# Patient Record
Sex: Male | Born: 2000 | Race: Black or African American | Hispanic: No | Marital: Single | Smoking: Never smoker
Health system: Southern US, Community
[De-identification: ages and names within clinical notes are randomized; demographics above are authoritative.]

## PROBLEM LIST (undated history)

## (undated) ENCOUNTER — Ambulatory Visit: Admission: EM | Payer: Medicaid Other | Source: Home / Self Care

## (undated) DIAGNOSIS — J45909 Unspecified asthma, uncomplicated: Secondary | ICD-10-CM

## (undated) DIAGNOSIS — F88 Other disorders of psychological development: Secondary | ICD-10-CM

## (undated) DIAGNOSIS — F819 Developmental disorder of scholastic skills, unspecified: Secondary | ICD-10-CM

## (undated) HISTORY — DX: Developmental disorder of scholastic skills, unspecified: F81.9

## (undated) HISTORY — DX: Other disorders of psychological development: F88

## (undated) HISTORY — DX: Unspecified asthma, uncomplicated: J45.909

---
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
## 2020-01-31 ENCOUNTER — Ambulatory Visit (INDEPENDENT_AMBULATORY_CARE_PROVIDER_SITE_OTHER): Payer: Medicaid Other | Admitting: Pediatrics

## 2020-01-31 ENCOUNTER — Encounter: Payer: Self-pay | Admitting: Pediatrics

## 2020-01-31 ENCOUNTER — Other Ambulatory Visit: Payer: Self-pay

## 2020-01-31 VITALS — Temp 98.1°F | Wt 115.2 lb

## 2020-01-31 DIAGNOSIS — R6889 Other general symptoms and signs: Secondary | ICD-10-CM

## 2020-01-31 LAB — POCT HEMOGLOBIN: Hemoglobin: 12.2 g/dL (ref 11–14.6)

## 2020-02-01 ENCOUNTER — Encounter: Payer: Self-pay | Admitting: Pediatrics

## 2020-02-01 LAB — COMPLETE METABOLIC PANEL WITH GFR
AG Ratio: 1.7 (calc) (ref 1.0–2.5)
ALT: 14 U/L (ref 8–46)
AST: 20 U/L (ref 12–32)
Albumin: 4.5 g/dL (ref 3.6–5.1)
Alkaline phosphatase (APISO): 77 U/L (ref 46–169)
BUN: 9 mg/dL (ref 7–20)
CO2: 27 mmol/L (ref 20–32)
Calcium: 9.7 mg/dL (ref 8.9–10.4)
Chloride: 103 mmol/L (ref 98–110)
Creat: 1.09 mg/dL (ref 0.60–1.26)
GFR, Est African American: 113 mL/min/{1.73_m2} (ref >=60)
GFR, Est Non African American: 98 mL/min/{1.73_m2} (ref >=60)
Globulin: 2.7 g/dL (calc) (ref 2.1–3.5)
Glucose, Bld: 95 mg/dL (ref 65–99)
Potassium: 4 mmol/L (ref 3.8–5.1)
Sodium: 139 mmol/L (ref 135–146)
Total Bilirubin: 0.4 mg/dL (ref 0.2–1.1)
Total Protein: 7.2 g/dL (ref 6.3–8.2)

## 2020-02-01 LAB — CBC WITH DIFFERENTIAL/PLATELET
Absolute Monocytes: 554 cells/uL (ref 200–950)
Basophils Absolute: 50 cells/uL (ref 0–200)
Basophils Relative: 1.2 %
Eosinophils Absolute: 59 cells/uL (ref 15–500)
Eosinophils Relative: 1.4 %
HCT: 43.3 % (ref 38.5–50.0)
Hemoglobin: 14.2 g/dL (ref 13.2–17.1)
Lymphs Abs: 1579 cells/uL (ref 850–3900)
MCH: 28.6 pg (ref 27.0–33.0)
MCHC: 32.8 g/dL (ref 32.0–36.0)
MCV: 87.3 fL (ref 80.0–100.0)
MPV: 10.5 fL (ref 7.5–12.5)
Monocytes Relative: 13.2 %
Neutro Abs: 1957 cells/uL (ref 1500–7800)
Neutrophils Relative %: 46.6 %
Platelets: 235 10*3/uL (ref 140–400)
RBC: 4.96 10*6/uL (ref 4.20–5.80)
RDW: 13.6 % (ref 11.0–15.0)
Total Lymphocyte: 37.6 %
WBC: 4.2 10*3/uL (ref 3.8–10.8)

## 2020-02-01 LAB — TSH+FREE T4: TSH W/REFLEX TO FT4: 3.96 mIU/L (ref 0.50–4.30)

## 2020-02-07 NOTE — Progress Notes (Signed)
Aaron Carroll is here today because his mom would like for him to have blood work. He complains about being cold often. The onset was years ago. No fever, no cough, no runny nose, no vomiting, no diarrhea, no night sweats. He eats 3 times a day. He drinks plenty of water. He does not take any new medications. There has been no sick contacts and no recent travel. He has not loss any hair and there has been no change in weight for him.    Thin male, calm and cooperative Sclera white  No palpable thyroid, no cervical lymphadenopathy Heart sounds normal, RRR, no murmur  Lungs clear  No focal deficits   20 yo male complaining of temperature irregularity  Blood work today to check general electrolytes and thyroid. Will also check hemoglobin Encourage a vitamin daily  Drink plenty of water daily  Questions and concerns were addressed  Follow up as needed

## 2020-03-14 ENCOUNTER — Emergency Department (HOSPITAL_COMMUNITY): Payer: Medicaid Other

## 2020-03-14 ENCOUNTER — Other Ambulatory Visit: Payer: Self-pay

## 2020-03-14 ENCOUNTER — Encounter (HOSPITAL_COMMUNITY): Payer: Self-pay | Admitting: Emergency Medicine

## 2020-03-14 ENCOUNTER — Emergency Department (HOSPITAL_COMMUNITY)
Admission: EM | Admit: 2020-03-14 | Discharge: 2020-03-14 | Disposition: A | Discharge status: Home / Self Care | Payer: Medicaid Other | Attending: Emergency Medicine | Admitting: Emergency Medicine

## 2020-03-14 DIAGNOSIS — J45909 Unspecified asthma, uncomplicated: Secondary | ICD-10-CM | POA: Insufficient documentation

## 2020-03-14 DIAGNOSIS — N4283 Cyst of prostate: Secondary | ICD-10-CM | POA: Diagnosis not present

## 2020-03-14 DIAGNOSIS — R1011 Right upper quadrant pain: Secondary | ICD-10-CM | POA: Diagnosis not present

## 2020-03-14 DIAGNOSIS — R110 Nausea: Secondary | ICD-10-CM | POA: Diagnosis not present

## 2020-03-14 DIAGNOSIS — F909 Attention-deficit hyperactivity disorder, unspecified type: Secondary | ICD-10-CM | POA: Insufficient documentation

## 2020-03-14 DIAGNOSIS — R1031 Right lower quadrant pain: Secondary | ICD-10-CM | POA: Diagnosis not present

## 2020-03-14 DIAGNOSIS — Z7951 Long term (current) use of inhaled steroids: Secondary | ICD-10-CM | POA: Insufficient documentation

## 2020-03-14 DIAGNOSIS — R1032 Left lower quadrant pain: Secondary | ICD-10-CM | POA: Diagnosis not present

## 2020-03-14 DIAGNOSIS — Z79899 Other long term (current) drug therapy: Secondary | ICD-10-CM | POA: Insufficient documentation

## 2020-03-14 DIAGNOSIS — R197 Diarrhea, unspecified: Secondary | ICD-10-CM | POA: Insufficient documentation

## 2020-03-14 LAB — LIPASE, BLOOD: Lipase: 25 U/L (ref 11–51)

## 2020-03-14 LAB — CBC
HCT: 46.9 % (ref 39.0–52.0)
Hemoglobin: 14.9 g/dL (ref 13.0–17.0)
MCH: 28.1 pg (ref 26.0–34.0)
MCHC: 31.8 g/dL (ref 30.0–36.0)
MCV: 88.3 fL (ref 80.0–100.0)
Platelets: 272 10*3/uL (ref 150–400)
RBC: 5.31 MIL/uL (ref 4.22–5.81)
RDW: 12.4 % (ref 11.5–15.5)
WBC: 4.8 10*3/uL (ref 4.0–10.5)
nRBC: 0 % (ref 0.0–0.2)

## 2020-03-14 LAB — COMPREHENSIVE METABOLIC PANEL
ALT: 17 U/L (ref 0–44)
AST: 22 U/L (ref 15–41)
Albumin: 4.9 g/dL (ref 3.5–5.0)
Alkaline Phosphatase: 71 U/L (ref 38–126)
Anion gap: 10 (ref 5–15)
BUN: 10 mg/dL (ref 6–20)
CO2: 26 mmol/L (ref 22–32)
Calcium: 9.6 mg/dL (ref 8.9–10.3)
Chloride: 103 mmol/L (ref 98–111)
Creatinine, Ser: 1.01 mg/dL (ref 0.61–1.24)
GFR calc Af Amer: >60 mL/min (ref >60)
GFR calc non Af Amer: >60 mL/min (ref >60)
Glucose, Bld: 93 mg/dL (ref 70–99)
Potassium: 4.1 mmol/L (ref 3.5–5.1)
Sodium: 139 mmol/L (ref 135–145)
Total Bilirubin: 1 mg/dL (ref 0.3–1.2)
Total Protein: 8.1 g/dL (ref 6.5–8.1)

## 2020-03-14 LAB — URINALYSIS, ROUTINE W REFLEX MICROSCOPIC
Bilirubin Urine: NEGATIVE
Glucose, UA: NEGATIVE mg/dL
Hgb urine dipstick: NEGATIVE
Ketones, ur: NEGATIVE mg/dL
Leukocytes,Ua: NEGATIVE
Nitrite: NEGATIVE
Protein, ur: NEGATIVE mg/dL
Specific Gravity, Urine: 1.017 (ref 1.005–1.030)
pH: 6 (ref 5.0–8.0)

## 2020-03-14 MED ORDER — ALUM & MAG HYDROXIDE-SIMETH 200-200-20 MG/5ML PO SUSP
30.0000 mL | Freq: Once | ORAL | Status: AC
  Administered 2020-03-14: 30 mL via ORAL
  Filled 2020-03-14: qty 30

## 2020-03-14 MED ORDER — OMEPRAZOLE 40 MG PO CPDR
40.0000 mg | DELAYED_RELEASE_CAPSULE | Freq: Every day | ORAL | 0 refills | Status: DC
Start: 2020-03-14 — End: 2021-02-08

## 2020-03-14 MED ORDER — LIDOCAINE VISCOUS HCL 2 % MT SOLN
15.0000 mL | Freq: Once | OROMUCOSAL | Status: AC
  Administered 2020-03-14: 15 mL via ORAL
  Filled 2020-03-14: qty 15

## 2020-03-14 MED ORDER — IOHEXOL 300 MG/ML  SOLN
100.0000 mL | Freq: Once | INTRAMUSCULAR | Status: AC | PRN
  Administered 2020-03-14: 75 mL via INTRAVENOUS

## 2020-03-14 MED ORDER — MORPHINE SULFATE (PF) 4 MG/ML IV SOLN
4.0000 mg | Freq: Once | INTRAVENOUS | Status: AC
  Administered 2020-03-14: 4 mg via INTRAVENOUS
  Filled 2020-03-14: qty 1

## 2020-03-14 MED ORDER — ONDANSETRON HCL 4 MG/2ML IJ SOLN
4.0000 mg | Freq: Once | INTRAMUSCULAR | Status: AC
  Administered 2020-03-14: 4 mg via INTRAVENOUS
  Filled 2020-03-14: qty 2

## 2020-03-14 MED ORDER — SODIUM CHLORIDE 0.9 % IV BOLUS
1000.0000 mL | Freq: Once | INTRAVENOUS | Status: AC
  Administered 2020-03-14: 1000 mL via INTRAVENOUS

## 2020-03-14 MED ORDER — FAMOTIDINE IN NACL 20-0.9 MG/50ML-% IV SOLN
20.0000 mg | Freq: Once | INTRAVENOUS | Status: AC
  Administered 2020-03-14: 20 mg via INTRAVENOUS
  Filled 2020-03-14: qty 50

## 2020-03-14 NOTE — ED Triage Notes (Signed)
Pt c/o sharp stabbing pain for the last three weeks. Pt endorses diarrhea x2 today.

## 2020-03-14 NOTE — ED Notes (Signed)
Reports Stabbing abd pain for tree weeks,   Yet pt has not sought eval for same   Ambulates erect, with easy gait, relaxed facial features , without guarding

## 2020-03-14 NOTE — Discharge Instructions (Addendum)
Your work-up today has been very reassuring, your lab work looks good.  The ultrasound of your gallbladder did not show any evidence of gallstones or other problems and your CT scan did not show anything to explain your pain.   CT did show a 1.5 cm simple prostate cyst, I do not think this is contributing to your pain, you can follow-up with your primary care doctor or urologist regarding this.  Your pain may be related to inflammation of the stomach or an ulcer, please take omeprazole daily at least 30 minutes before your first meal of the day, you can use Tylenol as needed for pain as well.  You can also use over-the-counter Maalox or Tums as needed for breakthrough pain.  Use the instructions provided to help modify your diet.

## 2020-03-14 NOTE — ED Provider Notes (Signed)
[facility] EMERGENCY DEPARTMENT Provider Note   CSN: [id number] Arrival date & time: 03/14/20  1223     History Chief Complaint  Patient presents with  . Abdominal Pain    Aaron Carroll is a 19 y.o. male.  Aaron Carroll is a 19 y.o. male with a history of asthma, learning disability and delayed social development, who presents to the emergency department for evaluation of right-sided abdominal pain.  He states that pain has been present for 2 to 3 weeks.  He has not sought evaluation for this pain until today.  He states over the past 3 days pain, worse.  He states it started a more mild sharp pain only in the right upper quadrant but over the past 3 days it has gotten more severe and is now present across the right side of the abdomen.  He states that it is constant and never goes away, but intermittently worsens.  He reports some nausea and decreased appetite but no vomiting.  Did have some diarrhea over the past few days, no melena or hematochezia.  He denies any associated chest pain or shortness of breath.  No flank pain, hematuria, dysuria or urinary frequency.  He has not taken any medications to treat the symptoms.  Denies similar history.  Denies any history of abdominal surgeries.  No other aggravating factors.        Past Medical History:  Diagnosis Date  . Delayed social development   . 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 Learning disabilities   . Unspecified asthma(493.90) 11/05/2012  . Unspecified asthma(493.90) 11/05/2012    Patient Active Problem List   Diagnosis Date Noted  . Attention deficit hyperactivity disorder (ADHD), combined type 12/26/2017  . Mild persistent asthma with acute exacerbation 05/13/2017  . Dry scalp 05/13/2017  . Weight loss 05/13/2017  . Learning disability 07/05/2015    History reviewed. No pertinent surgical history.     Family History  Problem Relation Age of Onset  . Healthy Mother   . Healthy Father   . Healthy  Maternal Grandmother   . Cancer Maternal Grandfather     Social History   Tobacco Use  . Smoking status: Never Smoker  . Smokeless tobacco: Never Used  Substance Use Topics  . Alcohol use: No  . Drug use: No    Home Medications Prior to Admission medications   Medication Sig Start Date End Date Taking? Authorizing Provider  albuterol (PROAIR HFA) 108 (90 Base) MCG/ACT inhaler INHALE 2 PUFFS INTO THE LUNGS EVERY 4-6 HOURS AS NEEDED FOR WHEEZING. 06/24/19   Rosiland Oz, MD  fluticasone (FLOVENT HFA) 220 MCG/ACT inhaler Inhale 2 puffs into the lungs 2 (two) times daily. INHALE 2 PUFFS TWICE DAILY AS DIRECTED. 12/07/19   Rosiland Oz, MD    Allergies    Patient has no known allergies.  Review of Systems   Review of Systems  Constitutional: Positive for appetite change. Negative for chills and fever.  HENT: Negative.   Respiratory: Negative for cough and shortness of breath.   Cardiovascular: Negative for chest pain.  Gastrointestinal: Positive for abdominal pain, diarrhea and nausea. Negative for vomiting.  Genitourinary: Negative for dysuria and frequency.  Musculoskeletal: Negative for arthralgias and myalgias.  Skin: Negative for color change and rash.  Neurological: Negative for dizziness, syncope and light-headedness.    Physical Exam Updated Vital Signs BP (!) 134/98 (BP Location: Right Arm)   Pulse 70   Temp 97.8 F (36.6 C) (Oral)  Resp 18   Ht 5\' 3"  (1.6 m)   Wt 50.3 kg   SpO2 98%   BMI 19.66 kg/m   Physical Exam Vitals and nursing note reviewed.  Constitutional:      General: He is not in acute distress.    Appearance: He is well-developed and normal weight. He is not diaphoretic.     Comments: Well-appearing and in no distress  HENT:     Head: Normocephalic and atraumatic.     Mouth/Throat:     Mouth: Mucous membranes are moist.     Pharynx: Oropharynx is clear.  Eyes:     General:        Right eye: No discharge.        Left eye: No  discharge.     Pupils: Pupils are equal, round, and reactive to light.  Cardiovascular:     Rate and Rhythm: Normal rate and regular rhythm.     Heart sounds: Normal heart sounds. No murmur heard.  No friction rub. No gallop.   Pulmonary:     Effort: Pulmonary effort is normal. No respiratory distress.     Breath sounds: Normal breath sounds. No wheezing or rales.     Comments: Respirations equal and unlabored, patient able to speak in full sentences, lungs clear to auscultation bilaterally Abdominal:     General: Abdomen is flat. Bowel sounds are normal. There is no distension.     Palpations: Abdomen is soft. There is no mass.     Tenderness: There is abdominal tenderness in the right upper quadrant and right lower quadrant. There is no guarding.     Comments: Abdomen is soft, nondistended, bowel sounds present throughout, tenderness in both the right upper and lower quadrants, most notably in the right upper quadrant, there is some mild voluntary guarding, but no peritoneal signs  Musculoskeletal:        General: No deformity.     Cervical back: Neck supple.  Skin:    General: Skin is warm and dry.     Capillary Refill: Capillary refill takes less than 2 seconds.  Neurological:     Mental Status: He is alert.     Coordination: Coordination normal.     Comments: Speech is clear, able to follow commands Moves extremities without ataxia, coordination intact  Psychiatric:        Mood and Affect: Mood normal.        Behavior: Behavior normal.     ED Results / Procedures / Treatments   Labs (all labs ordered are listed, but only abnormal results are displayed) Labs Reviewed  URINALYSIS, ROUTINE W REFLEX MICROSCOPIC - Abnormal; Notable for the following components:      Result Value   APPearance HAZY (*)    All other components within normal limits  LIPASE, BLOOD  COMPREHENSIVE METABOLIC PANEL  CBC    EKG None  Radiology CT ABDOMEN PELVIS W CONTRAST  Result Date:  03/14/2020 CLINICAL DATA:  Right lower quadrant and left lower quadrant abdominal pain EXAM: CT ABDOMEN AND PELVIS WITH CONTRAST TECHNIQUE: Multidetector CT imaging of the abdomen and pelvis was performed using the standard protocol following bolus administration of intravenous contrast. CONTRAST:  75mL OMNIPAQUE IOHEXOL 300 MG/ML  SOLN COMPARISON:  05/10/2018 FINDINGS: Lower chest: The visualized lung bases are clear. The visualized heart and pericardium are unremarkable. Hepatobiliary: Liver unremarkable. Gallbladder unremarkable. No intra or extrahepatic biliary ductal dilation. Pancreas: Unremarkable Spleen: Unremarkable Adrenals/Urinary Tract: Adrenal glands are unremarkable. Kidneys are normal. Bladder  is unremarkable. Stomach/Bowel: Stomach is within normal limits. Appendix appears normal. No evidence of bowel wall thickening, distention, or inflammatory changes. Vascular/Lymphatic: No significant vascular findings are present. No enlarged abdominal or pelvic lymph nodes. Reproductive: Midline posterior cyst within the prostate gland is compatible with a prostatic utricle measuring 15 mm. Prostate gland seminal vesicles are otherwise unremarkable. Other: No abdominal wall hernia or abnormality. No abdominopelvic ascites. Musculoskeletal: No acute or significant osseous findings. IMPRESSION: 1. No findings to explain the patient's symptoms. Electronically Signed   By: Helyn Numbers MD   On: 03/14/2020 17:12   US Abdomen Limited RUQ  Result Date: 03/14/2020 CLINICAL DATA:  Right upper quadrant pain EXAM: ULTRASOUND ABDOMEN LIMITED RIGHT UPPER QUADRANT COMPARISON:  None. FINDINGS: Gallbladder: No gallstones or wall thickening visualized. No sonographic Murphy sign noted by sonographer. Common bile duct: Diameter: Normal caliber, 2 mm Liver: No focal lesion identified. Within normal limits in parenchymal echogenicity. Portal vein is patent on color Doppler imaging with normal direction of blood flow towards  the liver. Other: None. IMPRESSION: Unremarkable right upper quadrant ultrasound. Electronically Signed   By: Charlett Nose M.D.   On: 03/14/2020 15:51    Procedures Procedures (including critical care time)  Medications Ordered in ED Medications  ondansetron (ZOFRAN) injection 4 mg (4 mg Intravenous Given 03/14/20 1603)  sodium chloride 0.9 % bolus 1,000 mL (1,000 mLs Intravenous New Bag/Given 03/14/20 1602)  morphine 4 MG/ML injection 4 mg (4 mg Intravenous Given 03/14/20 1620)  famotidine (PEPCID) IVPB 20 mg premix (20 mg Intravenous New Bag/Given 03/14/20 1624)  iohexol (OMNIPAQUE) 300 MG/ML solution 100 mL (75 mLs Intravenous Contrast Given 03/14/20 1638)    ED Course  I have reviewed the triage vital signs and the nursing notes.  Pertinent labs & imaging results that were available during my care of the patient were reviewed by me and considered in my medical decision making (see chart for details).    MDM Rules/Calculators/A&P                         Patient presents to the ED with complaints of abdominal pain. Patient nontoxic appearing, in no apparent distress, vitals WNL. On exam patient tender to location primarily in the right upper quadrant but also has some mild tenderness in the right lower quadrant, no peritoneal signs. Will evaluate with labs and right upper quadrant ultrasound, if negative may require CT. Analgesics, anti-emetics, and fluids administered.   ER work-up reviewed:  CBC: No leukocytosis, normal hemoglobin CMP: No significant electrolyte derangements, normal renal and liver function Lipase: WNL UA: No evidence of infection or hematuria  Imaging: Right upper quadrant ultrasound: Unremarkable without evidence of gallstones, cholecystitis, normal diameter of CBD  CT abdomen pelvis with no acute findings to explain patient's pain, incidental finding of a 15 mm prostate cyst.   On repeat abdominal exam patient remains without peritoneal signs, doubt  cholecystitis, pancreatitis, diverticulitis, appendicitis, bowel obstruction/perforation. Patient tolerating PO in the emergency department.  Symptoms may be related to gastritis or peptic ulcer disease, will treat with PPI and continued symptomatic treatment, encouraged GI follow-up.  Discussed urology follow-up for utricle cyst noted on prostate will discharge home with supportive measures. I discussed results, treatment plan, need for PCP follow-up, and return precautions with the patient. Provided opportunity for questions, patient confirmed understanding and is in agreement with plan.    Final Clinical Impression(s) / ED Diagnoses Final diagnoses:  RUQ abdominal pain  Rx / DC Orders ED Discharge Orders    None       Legrand Rams 03/16/20 1542    Vanetta Mulders, MD 03/27/20 [phone]

## 2020-03-15 ENCOUNTER — Telehealth: Payer: Self-pay | Admitting: *Deleted

## 2020-03-15 NOTE — Telephone Encounter (Signed)
Contacted pt   Transition Care Management Follow-up Telephone Call  . Medicaid Managed Care Transition Call Status:MM [facility] Call Made  . Date of discharge and from where: [facility], 03/15/20  . How have you been since you were released from the hospital? "still hurting"  . Any questions or concerns? No  Items Reviewed: Marland Kitchen Did the pt receive and understand the discharge instructions provided? Yes  . Medications obtained and verified? Yes pt to pickup prescription 03/15/20 . Any new allergies since your discharge? No  . Dietary orders reviewed?  No . Do you have support at home?  Yes, family  Functional Questionnaire: (I = Independent and D = Dependent)  ADLs: Independent Bathing/Dressing:Independent Meal Prep: Independent Eating: Independent Maintaining continence: Independent Transferring/Ambulation: Independent Managing Meds: Independent Follow up appointments reviewed:  PCP Hospital f/u appt confirmed? No Pt to contact Dr Meredeth Ide to schedule follow up appt on 03/15/20 Specialist Hospital f/u appt confirmed? Pt to contact Dr Jena Gauss, [facility]  Are transportation arrangements needed? No   If their condition worsens, is the pt aware to call PCP or go to the EmergencyDept.? Yes  Was the patient provided with contact information for the PCP's office or ED? yes  Was to pt encouraged to call back with questions or concerns? Yes  Burnard Bunting, RN, BSN, CCRN [facility] [phone]

## 2020-03-16 ENCOUNTER — Telehealth: Payer: Self-pay | Admitting: Internal Medicine

## 2020-03-16 NOTE — Telephone Encounter (Signed)
Ok to schedule new pt ov.  

## 2020-03-16 NOTE — Telephone Encounter (Signed)
ER REFERRAL  

## 2020-03-17 ENCOUNTER — Encounter: Payer: Self-pay | Admitting: Internal Medicine

## 2020-03-17 NOTE — Telephone Encounter (Signed)
PATIENT SCHEDULED  °

## 2020-03-21 ENCOUNTER — Encounter: Payer: Self-pay | Admitting: Pediatrics

## 2020-03-21 ENCOUNTER — Other Ambulatory Visit: Payer: Self-pay

## 2020-03-21 ENCOUNTER — Ambulatory Visit (INDEPENDENT_AMBULATORY_CARE_PROVIDER_SITE_OTHER): Payer: Medicaid Other | Admitting: Pediatrics

## 2020-03-21 VITALS — Temp 98.0°F | Wt 114.0 lb

## 2020-03-21 DIAGNOSIS — R634 Abnormal weight loss: Secondary | ICD-10-CM

## 2020-03-21 DIAGNOSIS — R109 Unspecified abdominal pain: Secondary | ICD-10-CM

## 2020-03-21 NOTE — Progress Notes (Signed)
  Subjective:     Patient ID: Aaron Carroll, male   DOB: 2000/10/11, 19 y.o.   MRN: [id number]  HPI  The patient is here alone today for follow up of a recent ED visit for abdominal pain. The patient states that his pain is actually more on the right side of his body and just a small area of his abdomen. He states that the pain has been there constantly for about 3 to 4 weeks. He says the pain always feels like a "sharp pain." He denies any known injury to the area. He denies any heavy lifting.  He states that he usually has soft stools, but, will sometimes have problems with hard stools.  The patient was referred to Urology by the ED for a prostatic cyst and to Gastroenterology for his pain.  The patient states that he has been taking ibuprofen about twice per day and it has not helped to decrease his pain.   Histories reviewed by MD    Review of Systems .Review of Symptoms: General ROS: negative for - fatigue and fever ENT ROS: negative for - headaches Respiratory ROS: no cough, shortness of breath, or wheezing Cardiovascular ROS: no chest pain or dyspnea on exertion Gastrointestinal ROS: negative for - change in bowel habits or nausea/vomiting     Objective:   Physical Exam Temp 98 F (36.7 C)   Wt 114 lb (51.7 kg)   BMI 20.19 kg/m   General Appearance:  Alert, cooperative, no distress, appears stated age  Head:  Normocephalic, without obvious abnormality, atraumatic  Eyes:  PERRL, conjunctiva/corneas clear, EOM's intact, fundi benign, both eyes  Ears:  Normal TM's and external ear canals, both ears  Nose: Nares normal, septum midline, mucosa normal, no drainage or sinus tenderness  Throat: Lips, mucosa, and tongue normal; teeth and gums normal  Neck: Supple, symmetrical, trachea midline, no adenopathy  Back:   Tenderness to palpation of right flank   Lungs:   Clear to auscultation bilaterally, respirations unlabored  Heart:  Regular rate and rhythm, S1, S2 normal, no  murmur, rub or gallop  Abdomen:   Soft,  no masses, no organomegaly; tenderness to right lower and upper quadrant  Skin: Skin color, texture, turgor normal, no rashes or lesions       Assessment:     Acute right flank pain Weight loss    Plan:     .1. Acute right flank pain MD reviewed patient's CT of abdomen, lab test results from ED visit and reviewed with patient Overall normal exam today, except for tenderness of right flank with palpation  Discontinue ibuprofen, try heat to area several times per day  - Ambulatory referral to Physical Therapy  2. Weight loss - discussed with patient importance of healthier eating and gave simple examples to try for meals, snacks and drinks     MD also reviewed with patient his upcoming appts with Urology and GI, patient was aware

## 2020-04-07 ENCOUNTER — Ambulatory Visit: Payer: Medicaid Other | Admitting: Urology

## 2020-04-07 ENCOUNTER — Ambulatory Visit
Admission: EM | Admit: 2020-04-07 | Discharge: 2020-04-07 | Disposition: A | Discharge status: Home / Self Care | Payer: Medicaid Other | Attending: Emergency Medicine | Admitting: Emergency Medicine

## 2020-04-07 DIAGNOSIS — Z711 Person with feared health complaint in whom no diagnosis is made: Secondary | ICD-10-CM | POA: Diagnosis not present

## 2020-04-07 NOTE — ED Triage Notes (Signed)
covid exposure ---- no symptoms  

## 2020-04-08 LAB — NOVEL CORONAVIRUS, NAA: SARS-CoV-2, NAA: NOT DETECTED

## 2020-04-11 ENCOUNTER — Ambulatory Visit (HOSPITAL_COMMUNITY): Payer: Medicaid Other | Attending: Pediatrics | Admitting: Physical Therapy

## 2020-04-11 DIAGNOSIS — M25551 Pain in right hip: Secondary | ICD-10-CM | POA: Insufficient documentation

## 2020-05-04 ENCOUNTER — Ambulatory Visit (HOSPITAL_COMMUNITY): Payer: Medicaid Other | Admitting: Physical Therapy

## 2020-05-04 ENCOUNTER — Other Ambulatory Visit: Payer: Self-pay

## 2020-05-04 DIAGNOSIS — M25551 Pain in right hip: Secondary | ICD-10-CM

## 2020-05-04 NOTE — Therapy (Signed)
[facility] [address] [place], [place], [address] Phone: [phone]   Fax:  [phone]  Physical Therapy Evaluation  Patient Details  Name: Aaron Carroll MRN: [id number] Date of Birth: 26-Jan-2001 Referring Provider (PT): Wyvonne Lenz    Encounter Date: 05/04/2020   PT End of Session - 05/04/20 1526    Visit Number 1    Number of Visits 4    Date for PT Re-Evaluation 06/03/20    Authorization Type healthy Blue    Progress Note Due on Visit 4    PT Start Time 1445    PT Stop Time 1520    PT Time Calculation (min) 35 min    Activity Tolerance Patient tolerated treatment well    Behavior During Therapy [facility] for tasks assessed/performed           Past Medical History:  Diagnosis Date   Delayed social development    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  There were no vitals filed for this visit.    Subjective Assessment - 05/04/20 1447    Subjective Pt states that he has had stabbing pain in his right side for the past two months. He states that the pain is getting worse.  He states that he has no idea what started his pain. His pain is constant.  He has had tests but all are negative.The pain is about at the level of the iliac crest and goes from the lateral aspect to anterior. Nothing makes the pain better and nothing makes it worse.  He is doing his normal activity he just carries on.    Pertinent History unremarkable    Currently in Pain? Yes    Pain Score 6     Pain Location Flank    Pain Orientation Right    Pain Descriptors / Indicators Aching;Burning    Pain Type Acute pain    Pain Onset More than a month ago    Pain Frequency Constant    Aggravating Factors  not sure    Pain Relieving Factors not sure    Effect of Pain on Daily Activities limits sleep, increased  pain with all activities,    Multiple Pain Sites No              [facility] PT Assessment - 05/04/20 0001      Assessment   Medical Diagnosis Rt flank pain     Referring Provider (PT) Wyvonne Lenz     Onset Date/Surgical Date 02/22/20    Next MD Visit --   not scheduled    Prior Therapy none      Precautions   Precautions None      Restrictions   Weight Bearing Restrictions No      Balance Screen   Has the patient fallen in the past 6 months No    Has the patient had a decrease in activity level because of a fear of falling?  No    Is the patient reluctant to leave their home because of a fear of falling?  No      Prior Function   Level of Independence Independent      Cognition   Overall Cognitive Status Within Functional Limits for tasks assessed      ROM / Strength   AROM / PROM / Strength AROM;Strength  AROM   Overall AROM Comments --   increased pain with all thoracic excursion exercises    AROM Assessment Site --   shld wnl no increase pain;       Strength   Overall Strength Comments --   Pt hip mm wfl but increased pain with all testing.    Strength Assessment Site --   UE wfl      Palpation   Palpation comment tender to palpation of Rt iliacus  with increased mm tone noted                       Objective measurements completed on examination: See above findings.       [facility] Adult PT Treatment/Exercise - 05/04/20 0001      Exercises   Exercises Knee/Hip      Lumbar Exercises: Stretches   Hip Flexor Stretch Right;2 reps;30 seconds      Lumbar Exercises: Supine   Ab Set 10 reps    Bent Knee Raise 10 reps      Manual Therapy   Manual Therapy Soft tissue mobilization    Manual therapy comments done seperate of all other aspects     Soft tissue mobilization to decrease tone and pain of Rt illiacus mm                   PT Education - 05/04/20 1523    Education Details HEP and self manual for iliacus mm    Person(s)  Educated Patient    Methods Explanation;Handout    Comprehension Verbalized understanding            PT Short Term Goals - 05/04/20 1612      PT SHORT TERM GOAL #1   Title PT to be I in HEP and massage to decrease Rt lower abdominal pain to no greater than a 4/10    Time 2    Period Weeks    Status New    Target Date 05/18/20             PT Long Term Goals - 05/04/20 1613      PT LONG TERM GOAL #1   Title Pt to be I in advance HEP to allow pain in Rt lower abdominal area to be no greater than a 2/10    Time 4    Period Weeks    Status New    Target Date 06/01/20                  Plan - 05/04/20 1526    Clinical Impression Statement Aaron Carroll a 19 yo male who as had Rt abdominal pain since July.  He went to the ER on 8/10 secondary to the pain where he had a (-) CT and (-) [place].  the pt continues to have RT sided lower abdominal pain that appears to follow the iliacus mm and has been referred to skilled PT.  Pt is tender to palpation and manual mm test of hip mm causes increased pain with all testing.  Pt has normal ROM but complains of increased pain with all thoracic excursions.  He has two other MD appointments with specialists in October.   I am not sure that his pain is musculoskeletal and not GI related, however, due to his significant increased discomfort we will see him and attempt to decrease his sx of pain.    Personal Factors and Comorbidities Behavior Pattern    Stability/Clinical Decision  Making Evolving/Moderate complexity    Clinical Decision Making Moderate    Rehab Potential Fair    PT Frequency 1x / week    PT Duration 4 weeks    PT Treatment/Interventions Manual techniques;Other (comment);Therapeutic exercise   laser   PT Next Visit Plan clam, hip ER/IR on stool, step ups and add laser in with manual    PT Home Exercise Plan thomas stretch, ab set, marching self manual    Consulted and Agree with Plan of Care Patient           Patient  will benefit from skilled therapeutic intervention in order to improve the following deficits and impairments:  Pain  Visit Diagnosis: Pain in right hip     Problem List Patient Active Problem List   Diagnosis Date Noted   Attention deficit hyperactivity disorder (ADHD), combined type 12/26/2017   Dry scalp 05/13/2017   Weight loss 05/13/2017   Learning disability 07/05/2015   Virgina Organ, PT CLT [phone] 05/04/2020, 4:16 PM  [facility] [address] [place], [place], [address] Phone: [phone]   Fax:  [phone]  Name: Aaron Carroll MRN: [id number] Date of Birth: 06-Oct-2000

## 2020-05-09 ENCOUNTER — Telehealth: Payer: Self-pay | Admitting: *Deleted

## 2020-05-09 NOTE — Telephone Encounter (Signed)
Hi, please refill patient's Flovent and Proair as appropriate. Pharmacy is [facility].  Thank you

## 2020-05-09 NOTE — Telephone Encounter (Signed)
Called to let mom know that she can call the pharmacy to request a refill order.

## 2020-05-09 NOTE — Telephone Encounter (Signed)
Patient's mom called requesting a refill on both of patient's inhalers the rescue and the one he uses daily.    [facility]

## 2020-05-09 NOTE — Telephone Encounter (Signed)
Please refill or instruct mother to call pharmacy for all refill requests 

## 2020-05-10 ENCOUNTER — Other Ambulatory Visit: Payer: Self-pay

## 2020-05-10 ENCOUNTER — Ambulatory Visit (HOSPITAL_COMMUNITY): Payer: Medicaid Other | Attending: Pediatrics

## 2020-05-10 ENCOUNTER — Encounter (HOSPITAL_COMMUNITY): Payer: Self-pay

## 2020-05-10 DIAGNOSIS — M25551 Pain in right hip: Secondary | ICD-10-CM | POA: Insufficient documentation

## 2020-05-10 NOTE — Patient Instructions (Addendum)
Clam    Lie on side, legs bent 90. Open top knee to ceiling, rotating leg outward. Touch toes to ankle of bottom leg. Close knees, rotating leg inward. Maintain hip position. Repeat 10 times, hold for 5 seconds. Repeat on other side. Do 2 sessions per day.  [email or web address]   Copyright  VHI. All rights reserved.

## 2020-05-10 NOTE — Therapy (Addendum)
[facility] [address] [place], [place], [address] Phone: [phone]   Fax:  [phone]  Physical Therapy Treatment and Discharge Note  Patient Details  Name: Aaron Carroll MRN: [id number] Date of Birth: [birth year] Referring Provider (PT): Carma Leaven   PHYSICAL THERAPY DISCHARGE SUMMARY  Visits from Start of Care: 2  Current functional level related to goals / functional outcomes: Unable to assess secondary to unplanned discharge   Remaining deficits: Unable to assess secondary to unplanned discharge   Education / Equipment: Unable to perform secondary to unplanned discharge Plan: Patient agrees to discharge.  Patient goals were not met. Patient is being discharged due to not returning since the last visit.  ?????      4:13 PM, 05/31/20 Jerene Pitch, DPT Physical Therapy with [facility]  [phone] office     Encounter Date: 05/10/2020   PT End of Session - 05/10/20 1629    Visit Number 2    Number of Visits 4    Date for PT Re-Evaluation 06/03/20    Authorization Type healthy Blue    Progress Note Due on Visit 4    PT Start Time 1620    PT Stop Time 1702   laser complete separate than rest of tx, not included wiht charges   PT Time Calculation (min) 42 min    Activity Tolerance Patient tolerated treatment well    Behavior During Therapy [facility] for tasks assessed/performed           Past Medical History:  Diagnosis Date  . Delayed social development   . 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 Learning disabilities   . Unspecified asthma(493.90) 11/05/2012  . Unspecified asthma(493.90) 11/05/2012    History reviewed. No pertinent surgical history.  There were no vitals filed for this visit.   Subjective Assessment - 05/10/20 1622    Subjective Pt stated he continues to have stabbing pain on his right side, pain scale 7/10.    Currently in Pain? Yes    Pain Score 7      Pain Location Flank    Pain Orientation Right    Pain Descriptors / Indicators Sharp    Pain Type Acute pain    Pain Onset More than a month ago    Aggravating Factors  not sure    Pain Relieving Factors not sure    Effect of Pain on Daily Activities limits sleep, increased pain with all activities              [facility] PT Assessment - 05/10/20 0001      Assessment   Medical Diagnosis Rt flank pain     Referring Provider (PT) Carma Leaven     Onset Date/Surgical Date 02/22/20    Next MD Visit reports has been scheduled, unsure date    Prior Therapy none                         [facility] Adult PT Treatment/Exercise - 05/10/20 0001      Exercises   Exercises Knee/Hip      Lumbar Exercises: Stretches   Hip Flexor Stretch Right;3 reps;30 seconds    Hip Flexor Stretch Limitations cueing for proper form      Lumbar Exercises: Standing   Other Standing Lumbar Exercises stool IR/ER      Lumbar Exercises: Supine   Ab Set 10 reps    Bent Knee Raise 10 reps  Bridge 10 reps      Lumbar Exercises: Sidelying   Clam Both;10 reps;5 seconds      Manual Therapy   Manual Therapy Other (comment)    Manual therapy comments Laser complete separate than rest of tx    Other Manual Therapy Laser acute musculature pain continuous 4x 1:03 at 27.1 Joules                  PT Education - 05/10/20 1628    Education Details Reviewed goals, educated importance of HEP compliance, pt unable to recall home exercises required cueing for mechanics and form    Person(s) Educated Patient    Methods Explanation;Verbal cues    Comprehension Verbalized understanding;Returned demonstration;Need further instruction            PT Short Term Goals - 05/04/20 1612      PT SHORT TERM GOAL #1   Title PT to be I in HEP and massage to decrease Rt lower abdominal pain to no greater than a 4/10    Time 2    Period Weeks    Status New    Target Date 05/18/20             PT  Long Term Goals - 05/04/20 1613      PT LONG TERM GOAL #1   Title Pt to be I in advance HEP to allow pain in Rt lower abdominal area to be no greater than a 2/10    Time 4    Period Weeks    Status New    Target Date 06/01/20                 Plan - 05/10/20 1632    Clinical Impression Statement Reviewed goals, educated importance of HEP compliance for maximal benefits.  Pt unable to recall current program so reviewed form and appropriate hold time.  Added hip mobility strengthening exercises, with min cueing for mechanics.  Pt reports pain reduced to 5/10.  EOS with laser to Rt flank.    Personal Factors and Comorbidities Behavior Pattern    Stability/Clinical Decision Making Evolving/Moderate complexity    Clinical Decision Making Moderate    Rehab Potential Fair    PT Frequency 1x / week    PT Duration 4 weeks    PT Treatment/Interventions Manual techniques;Other (comment);Therapeutic exercise   laser   PT Next Visit Plan Next session add resistance with clams, step ups and squat step around for IR and add laser in with manual    PT Home Exercise Plan thomas stretch, ab set, marching self manual; 10/06: clam           Patient will benefit from skilled therapeutic intervention in order to improve the following deficits and impairments:  Pain  Visit Diagnosis: Pain in right hip     Problem List Patient Active Problem List   Diagnosis Date Noted  . Attention deficit hyperactivity disorder (ADHD), combined type 12/26/2017  . Dry scalp 05/13/2017  . Weight loss 05/13/2017  . Learning disability 07/05/2015   Ihor Austin, LPTA/CLT; CBIS [phone]  Aldona Lento 05/10/2020, 5:30 PM  [facility] [address] [place], [place], [address] Phone: [phone]   Fax:  [phone]  Name: Aaron Carroll MRN: [id number] Date of Birth: 2000/12/20

## 2020-05-11 ENCOUNTER — Other Ambulatory Visit: Payer: Self-pay

## 2020-05-11 DIAGNOSIS — J4530 Mild persistent asthma, uncomplicated: Secondary | ICD-10-CM

## 2020-05-11 MED ORDER — FLOVENT HFA 220 MCG/ACT IN AERO: 2.0000 | INHALATION_SPRAY | Freq: Two times a day (BID) | RESPIRATORY_TRACT | 5 refills | Status: DC

## 2020-05-11 MED ORDER — ALBUTEROL SULFATE HFA 108 (90 BASE) MCG/ACT IN AERS: INHALATION_SPRAY | RESPIRATORY_TRACT | 2 refills | Status: AC

## 2020-05-11 NOTE — Telephone Encounter (Signed)
Pt's mother requesting RF on Albuterol inhaler and Flovent. Please send RF to [facility].  Last RF 06/24/19 #18 g 2 RF== Albuterol  Last RF 12/07/19 12 g 5 RF

## 2020-05-12 ENCOUNTER — Encounter (HOSPITAL_COMMUNITY): Payer: Medicaid Other | Admitting: Physical Therapy

## 2020-05-17 ENCOUNTER — Ambulatory Visit: Payer: Medicaid Other | Admitting: Nurse Practitioner

## 2020-05-17 ENCOUNTER — Ambulatory Visit (HOSPITAL_COMMUNITY): Payer: Medicaid Other

## 2020-05-17 ENCOUNTER — Encounter: Payer: Self-pay | Admitting: Internal Medicine

## 2020-05-17 ENCOUNTER — Telehealth (HOSPITAL_COMMUNITY): Payer: Self-pay

## 2020-05-17 ENCOUNTER — Encounter (HOSPITAL_COMMUNITY): Payer: Self-pay

## 2020-05-17 NOTE — Progress Notes (Deleted)
Primary Care Physician:  Rosiland Oz, MD Primary Gastroenterologist:  Dr. Marletta Lor  No chief complaint on file.   HPI:   Aaron Carroll is a 19 y.o. male who presents on referral for abdominal pain. Reviewed information associated with the referral including ***.   No previous colonoscopy or endoscopy in our system.  Today he states he's doing ok overall.   Past Medical History:  Diagnosis Date  . Delayed social development   . 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 Learning disabilities   . Unspecified asthma(493.90) 11/05/2012  . Unspecified asthma(493.90) 11/05/2012    No past surgical history on file.  Current Outpatient Medications  Medication Sig Dispense Refill  . albuterol (PROAIR HFA) 108 (90 Base) MCG/ACT inhaler INHALE 2 PUFFS INTO THE LUNGS EVERY 4-6 HOURS AS NEEDED FOR WHEEZING. 18 g 2  . fluticasone (FLOVENT HFA) 220 MCG/ACT inhaler Inhale 2 puffs into the lungs 2 (two) times daily. INHALE 2 PUFFS TWICE DAILY AS DIRECTED. 12 g 5  . omeprazole (PRILOSEC) 40 MG capsule Take 1 capsule (40 mg total) by mouth daily. 30 capsule 0   No current facility-administered medications for this visit.    Allergies as of 05/17/2020  . (No Known Allergies)    Family History  Problem Relation Age of Onset  . Healthy Mother   . Healthy Father   . Healthy Maternal Grandmother   . Cancer Maternal Grandfather     Social History   Socioeconomic History  . Marital status: Single    Spouse name: Not on file  . Number of children: Not on file  . Years of education: Not on file  . Highest education level: Not on file  Occupational History  . Not on file  Tobacco Use  . Smoking status: Never Smoker  . Smokeless tobacco: Never Used  Substance and Sexual Activity  . Alcohol use: No  . Drug use: No  . Sexual activity: Never  Other Topics Concern  . Not on file  Social History Narrative   Lives with mother, father, sister and brother       Learning  disabilities, 12th grade (2020 - 2021)       [place]       Wants to be Emergency planning/management officer    Social Determinants of Health   Financial Resource Strain:   . Difficulty of Paying Living Expenses: Not on file  Food Insecurity:   . Worried About Programme researcher, broadcasting/film/video in the Last Year: Not on file  . Ran Out of Food in the Last Year: Not on file  Transportation Needs:   . Lack of Transportation (Medical): Not on file  . Lack of Transportation (Non-Medical): Not on file  Physical Activity:   . Days of Exercise per Week: Not on file  . Minutes of Exercise per Session: Not on file  Stress:   . Feeling of Stress : Not on file  Social Connections:   . Frequency of Communication with Friends and Family: Not on file  . Frequency of Social Gatherings with Friends and Family: Not on file  . Attends Religious Services: Not on file  . Active Member of Clubs or Organizations: Not on file  . Attends Banker Meetings: Not on file  . Marital Status: Not on file  Intimate Partner Violence:   . Fear of Current or Ex-Partner: Not on file  . Emotionally Abused: Not on file  . Physically Abused: Not on file  .  Sexually Abused: Not on file    Subjective:*** Review of Systems  Constitutional: Negative for chills, fever, malaise/fatigue and weight loss.  HENT: Negative for congestion and sore throat.   Respiratory: Negative for cough and shortness of breath.   Cardiovascular: Negative for chest pain and palpitations.  Gastrointestinal: Negative for abdominal pain, blood in stool, diarrhea, melena, nausea and vomiting.  Musculoskeletal: Negative for joint pain and myalgias.  Skin: Negative for rash.  Neurological: Negative for dizziness and weakness.  Endo/Heme/Allergies: Does not bruise/bleed easily.  Psychiatric/Behavioral: Negative for depression. The patient is not nervous/anxious.   All other systems reviewed and are negative.      Objective: There were no vitals taken for this  visit. Physical Exam Vitals and nursing note reviewed.  Constitutional:      General: He is not in acute distress.    Appearance: Normal appearance. He is not ill-appearing, toxic-appearing or diaphoretic.  HENT:     Head: Normocephalic and atraumatic.     Nose: No congestion or rhinorrhea.  Eyes:     General: No scleral icterus. Cardiovascular:     Rate and Rhythm: Normal rate and regular rhythm.     Heart sounds: Normal heart sounds.  Pulmonary:     Effort: Pulmonary effort is normal.     Breath sounds: Normal breath sounds.  Abdominal:     General: Bowel sounds are normal. There is no distension.     Palpations: Abdomen is soft. There is no hepatomegaly, splenomegaly or mass.     Tenderness: There is no abdominal tenderness. There is no guarding or rebound.     Hernia: No hernia is present.  Musculoskeletal:     Cervical back: Neck supple.  Skin:    General: Skin is warm and dry.     Coloration: Skin is not jaundiced.     Findings: No bruising or rash.  Neurological:     General: No focal deficit present.     Mental Status: He is alert and oriented to person, place, and time. Mental status is at baseline.  Psychiatric:        Mood and Affect: Mood normal.        Behavior: Behavior normal.        Thought Content: Thought content normal.      Assessment:  ***   Plan: ***    Thank you for allowing [place] to participate in the care of Aaron Carroll  Wynne Dust, DNP, AGNP-C Adult & Gerontological Nurse Practitioner [facility]   05/17/2020 7:42 AM   Disclaimer: This note was dictated with voice recognition software. Similar sounding words can inadvertently be transcribed and may not be corrected upon review.

## 2020-05-17 NOTE — Telephone Encounter (Signed)
No show, called and spoke with patient who stated he thought his apt was scheduled at 5:30.  Reminded next apt date and time, verbalized he will be there.    Becky Sax, LPTA/CLT; [place] [phone]

## 2020-05-23 ENCOUNTER — Ambulatory Visit: Payer: Medicaid Other | Admitting: Urology

## 2020-05-24 ENCOUNTER — Ambulatory Visit (HOSPITAL_COMMUNITY): Payer: Medicaid Other | Admitting: Physical Therapy

## 2020-05-24 ENCOUNTER — Telehealth (HOSPITAL_COMMUNITY): Payer: Self-pay | Admitting: Physical Therapy

## 2020-05-24 NOTE — Telephone Encounter (Signed)
No show # 2.  Mail box full unable to leave any message.   Virgina Organ, PT CLT [phone]

## 2020-05-31 ENCOUNTER — Ambulatory Visit (HOSPITAL_COMMUNITY): Payer: Medicaid Other | Admitting: Physical Therapy

## 2020-05-31 ENCOUNTER — Telehealth (HOSPITAL_COMMUNITY): Payer: Self-pay | Admitting: Physical Therapy

## 2020-05-31 NOTE — Telephone Encounter (Signed)
No Show #3. Called patient and unable to leave voicemail secondary to full mailbox. This is the patient's third no show. Will discharge patient at this time secondary to no show/cancellation policy.   4:10 PM, 05/31/20 Tereasa Coop, DPT Physical Therapy with [facility]  [phone] office

## 2020-07-14 DIAGNOSIS — Z23 Encounter for immunization: Secondary | ICD-10-CM | POA: Diagnosis not present

## 2020-07-18 DIAGNOSIS — Z0279 Encounter for issue of other medical certificate: Secondary | ICD-10-CM

## 2020-08-11 DIAGNOSIS — Z23 Encounter for immunization: Secondary | ICD-10-CM | POA: Diagnosis not present

## 2020-09-16 ENCOUNTER — Encounter (HOSPITAL_COMMUNITY): Payer: Self-pay | Admitting: Emergency Medicine

## 2020-09-16 ENCOUNTER — Other Ambulatory Visit: Payer: Self-pay

## 2020-09-16 ENCOUNTER — Emergency Department (HOSPITAL_COMMUNITY)
Admission: EM | Admit: 2020-09-16 | Discharge: 2020-09-16 | Disposition: A | Discharge status: Home / Self Care | Payer: Medicaid Other | Attending: Emergency Medicine | Admitting: Emergency Medicine

## 2020-09-16 DIAGNOSIS — Z79899 Other long term (current) drug therapy: Secondary | ICD-10-CM | POA: Diagnosis not present

## 2020-09-16 DIAGNOSIS — R109 Unspecified abdominal pain: Secondary | ICD-10-CM | POA: Diagnosis not present

## 2020-09-16 DIAGNOSIS — S3991XA Unspecified injury of abdomen, initial encounter: Secondary | ICD-10-CM | POA: Diagnosis present

## 2020-09-16 DIAGNOSIS — J45909 Unspecified asthma, uncomplicated: Secondary | ICD-10-CM | POA: Diagnosis not present

## 2020-09-16 DIAGNOSIS — S39012A Strain of muscle, fascia and tendon of lower back, initial encounter: Secondary | ICD-10-CM | POA: Diagnosis not present

## 2020-09-16 DIAGNOSIS — X58XXXA Exposure to other specified factors, initial encounter: Secondary | ICD-10-CM | POA: Insufficient documentation

## 2020-09-16 DIAGNOSIS — S39011A Strain of muscle, fascia and tendon of abdomen, initial encounter: Secondary | ICD-10-CM | POA: Insufficient documentation

## 2020-09-16 DIAGNOSIS — T148XXA Other injury of unspecified body region, initial encounter: Secondary | ICD-10-CM

## 2020-09-16 MED ORDER — LIDOCAINE 5 % EX PTCH: 1.0000 | MEDICATED_PATCH | CUTANEOUS | 0 refills | Status: DC

## 2020-09-16 MED ORDER — METHOCARBAMOL 500 MG PO TABS
500.0000 mg | ORAL_TABLET | Freq: Two times a day (BID) | ORAL | 0 refills | Status: DC
Start: 2020-09-16 — End: 2022-12-03

## 2020-09-16 NOTE — Discharge Instructions (Addendum)
Please follow-up with your physical therapy Please follow-up with your primary care doctor You may always return to the ER if you experience new or concerning symptoms  Please use Tylenol or ibuprofen for pain.  You may use 600 mg ibuprofen every 6 hours or 1000 mg of Tylenol every 6 hours.  You may choose to alternate between the 2.  This would be most effective.  Not to exceed 4 g of Tylenol within 24 hours.  Not to exceed 3200 mg ibuprofen 24 hours.  I have also prescribed you a muscle relaxer called Robaxin as well as lidocaine patches.  The Robaxin may cause you to have some drowsiness.  Please take this at home before you use it at work.  Do not operate any heavy machinery or use a gun while you are using Robaxin.  I strongly recommend warm compresses and gentle stretching of the area.

## 2020-09-16 NOTE — ED Provider Notes (Signed)
[facility] EMERGENCY DEPARTMENT Provider Note   CSN: [id number] Arrival date & time: 09/16/20  1650     History Chief Complaint  Patient presents with  . Flank Pain    Aaron Carroll is a 20 y.o. male.  HPI Patient presents to the ER today he is a 20 year old male with a past medical history significant for learning disabilities and mild intermittent asthma  Patient is presented today with right-sided waist/flank pain.  He states this is been ongoing and unchanged for 6 months.  He states it is frustrating and causing him to lose sleep at night.  He denies any new symptoms.  Denies any nausea, vomiting, lightheadedness, dizziness, chest pain or shortness of breath.  He states it is worse when he is at work.  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  He was seen in the ER for this 6 months ago and had an extensive work-up.  He states he was told to follow-up with urology.  Denies any history of kidney stones.  No hematuria dysuria frequency urgency.    Past Medical History:  Diagnosis Date  . Delayed social development   . 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 Learning disabilities   . Unspecified asthma(493.90) 11/05/2012  . Unspecified asthma(493.90) 11/05/2012    Patient Active Problem List   Diagnosis Date Noted  . Attention deficit hyperactivity disorder (ADHD), combined type 12/26/2017  . Dry scalp 05/13/2017  . Weight loss 05/13/2017  . Learning disability 07/05/2015    History reviewed. No pertinent surgical history.     Family History  Problem Relation Age of Onset  . Healthy Mother   . Healthy Father   . Healthy Maternal Grandmother   . Cancer Maternal Grandfather     Social History   Tobacco Use  . Smoking status: Never Smoker  . Smokeless tobacco: Never Used  Substance Use Topics  . Alcohol use: No  . Drug use: No    Home Medications Prior to Admission medications    Medication Sig Start Date End Date Taking? Authorizing Provider  lidocaine (LIDODERM) 5 % Place 1 patch onto the skin daily. Remove & Discard patch within 12 hours or as directed by MD 09/16/20  Yes Adonnis Salceda, Rodrigo Ran, PA  methocarbamol (ROBAXIN) 500 MG tablet Take 1 tablet (500 mg total) by mouth 2 (two) times daily. 09/16/20  Yes Ravleen Ries S, PA  albuterol (PROAIR HFA) 108 (90 Base) MCG/ACT inhaler INHALE 2 PUFFS INTO THE LUNGS EVERY 4-6 HOURS AS NEEDED FOR WHEEZING. 05/11/20   Rosiland Oz, MD  fluticasone (FLOVENT HFA) 220 MCG/ACT inhaler Inhale 2 puffs into the lungs 2 (two) times daily. INHALE 2 PUFFS TWICE DAILY AS DIRECTED. 05/11/20   Rosiland Oz, MD  omeprazole (PRILOSEC) 40 MG capsule Take 1 capsule (40 mg total) by mouth daily. 03/14/20   Dartha Lodge, PA-C    Allergies    Patient has no known allergies.  Review of Systems   Review of Systems  Constitutional: Negative for chills and fever.  HENT: Negative for congestion.   Eyes: Negative for pain.  Respiratory: Negative for cough and shortness of breath.   Cardiovascular: Negative for chest pain and leg swelling.  Gastrointestinal: Negative for abdominal pain and vomiting.  Genitourinary: Positive for flank pain. Negative for decreased urine volume, dysuria, genital sores, hematuria, penile discharge, penile pain, penile swelling, scrotal swelling, testicular pain and urgency.  Musculoskeletal: Negative for myalgias.  Skin: Negative for rash.  Neurological: Negative for dizziness and headaches.    Physical Exam Updated Vital Signs BP 116/80 (BP Location: Right Arm)   Pulse 78   Temp 98.5 F (36.9 C)   Resp 16   Ht 5\' 3"  (1.6 m)   Wt 53 kg   SpO2 98%   BMI 20.71 kg/m   Physical Exam Vitals and nursing note reviewed.  Constitutional:      General: He is not in acute distress. HENT:     Head: Normocephalic and atraumatic.     Nose: Nose normal.  Eyes:     General: No scleral  icterus. Cardiovascular:     Rate and Rhythm: Normal rate and regular rhythm.     Pulses: Normal pulses.     Heart sounds: Normal heart sounds.  Pulmonary:     Effort: Pulmonary effort is normal. No respiratory distress.     Breath sounds: No wheezing.  Abdominal:     Palpations: Abdomen is soft.     Tenderness: There is no abdominal tenderness. There is no right CVA tenderness, left CVA tenderness, guarding or rebound.  Musculoskeletal:     Cervical back: Normal range of motion.     Right lower leg: No edema.     Left lower leg: No edema.     Comments: Patient has right-sided oblique and right paralumbar muscular tenderness to palpation.  This seems to recreate his pain.  Skin:    General: Skin is warm and dry.     Capillary Refill: Capillary refill takes less than 2 seconds.  Neurological:     Mental Status: He is alert. Mental status is at baseline.  Psychiatric:        Mood and Affect: Mood normal.        Behavior: Behavior normal.     ED Results / Procedures / Treatments   Labs (all labs ordered are listed, but only abnormal results are displayed) Labs Reviewed - No data to display  EKG None  Radiology No results found.  Procedures Procedures   Medications Ordered in ED Medications - No data to display  ED Course  I have reviewed the triage vital signs and the nursing notes.  Pertinent labs & imaging results that were available during my care of the patient were reviewed by me and considered in my medical decision making (see chart for details).    MDM Rules/Calculators/A&P                          Patient is healthy 20 year old male presented today with right flank pain for over 6 months.  He was evaluated in the ER 03/14/2020 for the exact same symptoms.  He had CT imaging of his abdomen pelvis as well as right upper quadrant ultrasound.  These did not show any abnormalities apart from an incidental finding of a prostatic cyst.  He is following up with  urology for this.  He already has appointments with urology and gastroenterology.  He has been seen by his primary care doctor who scheduled him to follow-up with physical therapy however he has not showed up for his scheduled appointment x3.  Given patient's extensive work-up already I have low suspicion for there being a organic cause of this that requires evaluation here in the ER.  He has been extensively worked up in the past.  His symptoms did not change.  He is here primarily because  he is frustrated with his ongoing symptoms.  However he has not followed up with his physical therapist.  Nor has he seen his specialist yet.  We will discharge patient with muscle relaxer recommend Tylenol ibuprofen and Lidoderm patches.  Given return precautions.  He is understanding of plan and agreeable.  Final Clinical Impression(s) / ED Diagnoses Final diagnoses:  Muscle strain  Side pain    Rx / DC Orders ED Discharge Orders         Ordered    methocarbamol (ROBAXIN) 500 MG tablet  2 times daily        09/16/20 1801    lidocaine (LIDODERM) 5 %  Every 24 hours        09/16/20 1801           Gailen Shelter, [place] 09/16/20 Ninfa Linden    Benjiman Core, MD 09/16/20 2350

## 2020-09-16 NOTE — ED Triage Notes (Signed)
Pt c/o right sided flank pain that's been going on for about a year. Pt has seen pcp for issue and pcp was unable to find out whats wrong.

## 2020-12-26 ENCOUNTER — Other Ambulatory Visit: Payer: Self-pay

## 2020-12-26 ENCOUNTER — Ambulatory Visit (INDEPENDENT_AMBULATORY_CARE_PROVIDER_SITE_OTHER): Payer: Medicaid Other | Admitting: Nurse Practitioner

## 2020-12-26 ENCOUNTER — Encounter (INDEPENDENT_AMBULATORY_CARE_PROVIDER_SITE_OTHER): Payer: Self-pay | Admitting: Nurse Practitioner

## 2020-12-26 VITALS — BP 112/78 | HR 74 | Temp 97.3°F | Ht 65.0 in | Wt 117.2 lb

## 2020-12-26 DIAGNOSIS — Z1329 Encounter for screening for other suspected endocrine disorder: Secondary | ICD-10-CM

## 2020-12-26 DIAGNOSIS — Z131 Encounter for screening for diabetes mellitus: Secondary | ICD-10-CM

## 2020-12-26 DIAGNOSIS — R002 Palpitations: Secondary | ICD-10-CM

## 2020-12-26 DIAGNOSIS — Z13220 Encounter for screening for lipoid disorders: Secondary | ICD-10-CM | POA: Diagnosis not present

## 2020-12-26 NOTE — Patient Instructions (Signed)
Vitamin D3 - 1,000IUs/capsule. Take 1 capsule by mouth daily.

## 2020-12-26 NOTE — Progress Notes (Signed)
Subjective:  Patient ID: Aaron Carroll, male    DOB: Oct 21, 2000  Age: 20 y.o. MRN: [id number]  CC:  Chief Complaint  Patient presents with  . Establish Care  . Other    Chest pain      HPI  This patient arrives today for the above.  This patient comes in today for the above.  He tells me has been a few years since has been to primary care provider but wanted to establish care now.  He is not have any specific complaints upon rooming, however during review of systems he did mention he does get some episodes of shortness of breath with chest pain and cardiac palpitations.  He tells me has been going on "all my life".  He tells me he will take his albuterol inhaler as he does have a history of asthma but this will sometimes make the cardiac palpitations worse.  He tells me the trigger is usually when he is playing vigorously with siblings.  Past Medical History:  Diagnosis Date  . Delayed social development   . 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 Learning disabilities   . Unspecified asthma(493.90) 11/05/2012  . Unspecified asthma(493.90) 11/05/2012      Family History  Problem Relation Age of Onset  . Healthy Mother   . Healthy Father   . Healthy Maternal Grandmother   . Cancer Maternal Grandfather     Social History   Social History Narrative   Lives with mother, father, sister and brother       Learning disabilities, 12th grade (2020 - 2021)       Social History   Tobacco Use  . Smoking status: Never Smoker  . Smokeless tobacco: Never Used  Substance Use Topics  . Alcohol use: No     Current Meds  Medication Sig  . albuterol (PROAIR HFA) 108 (90 Base) MCG/ACT inhaler INHALE 2 PUFFS INTO THE LUNGS EVERY 4-6 HOURS AS NEEDED FOR WHEEZING.  . fluticasone (FLOVENT HFA) 220 MCG/ACT inhaler Inhale 2 puffs into the lungs 2 (two) times daily. INHALE 2 PUFFS TWICE DAILY AS DIRECTED.  Marland Kitchen lidocaine (LIDODERM) 5 % Place 1 patch onto the skin daily. Remove &  Discard patch within 12 hours or as directed by MD  . methocarbamol (ROBAXIN) 500 MG tablet Take 1 tablet (500 mg total) by mouth 2 (two) times daily.  Marland Kitchen omeprazole (PRILOSEC) 40 MG capsule Take 1 capsule (40 mg total) by mouth daily.    ROS:  Review of Systems  Eyes: Negative for blurred vision.  Respiratory: Positive for shortness of breath and wheezing.   Cardiovascular: Positive for chest pain and palpitations.  Gastrointestinal: Negative for abdominal pain, blood in stool and melena.  Musculoskeletal: Positive for back pain.  Neurological: Negative for dizziness, loss of consciousness and headaches.  Psychiatric/Behavioral: Negative for depression and suicidal ideas.     Objective:   Today's Vitals: BP 112/78   Pulse 74   Temp (!) 97.3 F (36.3 C)   Ht _0  (1.651 m)   Wt 117 lb 3.2 oz (53.2 kg)   BMI 19.50 kg/m  Vitals with BMI 12/26/2020 09/16/2020 04/07/2020  Height _1  _2  -  Weight 117 lbs 3 oz 116 lbs 14 oz -  BMI 19.3 79.02 -  Systolic 409 735 329  Diastolic 78 80 82  Pulse 74 78 75     Physical Exam Vitals reviewed.  Constitutional:      Appearance: Normal appearance.  HENT:     Head: Normocephalic and atraumatic.  Cardiovascular:     Rate and Rhythm: Normal rate and regular rhythm.  Pulmonary:     Effort: Pulmonary effort is normal.     Breath sounds: Normal breath sounds.  Musculoskeletal:     Cervical back: Neck supple.  Skin:    General: Skin is warm and dry.  Neurological:     Mental Status: He is alert and oriented to person, place, and time.  Psychiatric:        Mood and Affect: Mood normal.        Behavior: Behavior normal.        Thought Content: Thought content normal.        Judgment: Judgment normal.       EKG: Sinus rhythm evidence of left atrial enlargement   Assessment and Plan   1. Palpitations   2. Screening, lipid   3. Screening for diabetes mellitus   4. Screening for thyroid disorder      Plan: 1.  Most  likely etiology is that when he is exerting himself he has a flare of asthma and then after he takes his albuterol inhaler he becomes tachycardic which is a common side effect of albuterol.  However because it does result in pain as well as shortness of breath I am can refer him to cardiology for evaluation to rule out any cardiac pathology. 2.-4.  We will check blood work today for further evaluation.   Tests ordered Orders Placed This Encounter  Procedures  . CBC with Differential/Platelets  . CMP with eGFR([facility])  . Lipid Panel  . Hemoglobin A1c  . TSH  . EKG 12-Lead      No orders of the defined types were placed in this encounter.   Patient to follow-up in 1 month or sooner as needed.  Ailene Ards, NP

## 2020-12-27 LAB — COMPLETE METABOLIC PANEL WITH GFR
AG Ratio: 1.9 (calc) (ref 1.0–2.5)
ALT: 12 U/L (ref 9–46)
AST: 17 U/L (ref 10–40)
Albumin: 4.6 g/dL (ref 3.6–5.1)
Alkaline phosphatase (APISO): 62 U/L (ref 36–130)
BUN: 9 mg/dL (ref 7–25)
CO2: 31 mmol/L (ref 20–32)
Calcium: 9.5 mg/dL (ref 8.6–10.3)
Chloride: 104 mmol/L (ref 98–110)
Creat: 0.98 mg/dL (ref 0.60–1.35)
GFR, Est African American: 128 mL/min/{1.73_m2} (ref >=60)
GFR, Est Non African American: 111 mL/min/{1.73_m2} (ref >=60)
Globulin: 2.4 g/dL (calc) (ref 1.9–3.7)
Glucose, Bld: 74 mg/dL (ref 65–99)
Potassium: 4 mmol/L (ref 3.5–5.3)
Sodium: 141 mmol/L (ref 135–146)
Total Bilirubin: 0.9 mg/dL (ref 0.2–1.2)
Total Protein: 7 g/dL (ref 6.1–8.1)

## 2020-12-27 LAB — CBC WITH DIFFERENTIAL/PLATELET
Absolute Monocytes: 564 cells/uL (ref 200–950)
Basophils Absolute: 51 cells/uL (ref 0–200)
Basophils Relative: 0.9 %
Eosinophils Absolute: 91 cells/uL (ref 15–500)
Eosinophils Relative: 1.6 %
HCT: 45.2 % (ref 38.5–50.0)
Hemoglobin: 14.6 g/dL (ref 13.2–17.1)
Lymphs Abs: 1134 cells/uL (ref 850–3900)
MCH: 27.4 pg (ref 27.0–33.0)
MCHC: 32.3 g/dL (ref 32.0–36.0)
MCV: 85 fL (ref 80.0–100.0)
MPV: 10.4 fL (ref 7.5–12.5)
Monocytes Relative: 9.9 %
Neutro Abs: 3859 cells/uL (ref 1500–7800)
Neutrophils Relative %: 67.7 %
Platelets: 268 10*3/uL (ref 140–400)
RBC: 5.32 10*6/uL (ref 4.20–5.80)
RDW: 12.1 % (ref 11.0–15.0)
Total Lymphocyte: 19.9 %
WBC: 5.7 10*3/uL (ref 3.8–10.8)

## 2020-12-27 LAB — HEMOGLOBIN A1C
Hgb A1c MFr Bld: 5.4 % of total Hgb (ref <5.7)
Mean Plasma Glucose: 108 mg/dL
eAG (mmol/L): 6 mmol/L

## 2020-12-27 LAB — LIPID PANEL
Cholesterol: 135 mg/dL (ref <200)
HDL: 45 mg/dL (ref >=40)
LDL Cholesterol (Calc): 72 mg/dL (calc)
Non-HDL Cholesterol (Calc): 90 mg/dL (calc) (ref <130)
Total CHOL/HDL Ratio: 3 (calc) (ref <5.0)
Triglycerides: 98 mg/dL (ref <150)

## 2020-12-27 LAB — TSH: TSH: 0.89 mIU/L (ref 0.40–4.50)

## 2020-12-30 ENCOUNTER — Encounter (INDEPENDENT_AMBULATORY_CARE_PROVIDER_SITE_OTHER): Payer: Self-pay | Admitting: Nurse Practitioner

## 2021-02-08 ENCOUNTER — Telehealth (INDEPENDENT_AMBULATORY_CARE_PROVIDER_SITE_OTHER): Payer: Self-pay | Admitting: Nurse Practitioner

## 2021-02-08 ENCOUNTER — Other Ambulatory Visit: Payer: Self-pay

## 2021-02-08 ENCOUNTER — Ambulatory Visit (INDEPENDENT_AMBULATORY_CARE_PROVIDER_SITE_OTHER): Payer: Medicaid Other | Admitting: Nurse Practitioner

## 2021-02-08 ENCOUNTER — Encounter (INDEPENDENT_AMBULATORY_CARE_PROVIDER_SITE_OTHER): Payer: Self-pay | Admitting: Nurse Practitioner

## 2021-02-08 VITALS — BP 94/64 | HR 84 | Temp 97.3°F | Ht 65.0 in | Wt 118.8 lb

## 2021-02-08 DIAGNOSIS — M545 Low back pain, unspecified: Secondary | ICD-10-CM | POA: Diagnosis not present

## 2021-02-08 DIAGNOSIS — G8929 Other chronic pain: Secondary | ICD-10-CM | POA: Diagnosis not present

## 2021-02-08 NOTE — Telephone Encounter (Signed)
Will you look into referral to cardiology? Patient tells me he has not heard from their office yet.

## 2021-02-08 NOTE — Progress Notes (Signed)
Subjective:  Patient ID: Aaron Carroll, male    DOB: 2000/11/10  Age: 20 y.o. 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  He tells me he has been having mid to low back pain for around 9 years.  He tells me the pain is constant and rates as a 5/10 intensity and describes as a stabbing pain.  He tells me he has not tried any over-the-counter or prescription medication to treat the pain but has tried ice since has not helped in the past.  He reports that he has had an x-ray at [facility] within the last 2 years and was told he had a crooked spine however I do not see evidence of this in the medical record system.  He denies any numbness, tingling, reduced strength.  He tells me that sitting up straight as well as bending over seems to make the pain worse.  His main concern is that he works at [employer] and when he is required to pick up heavy boxes it seems to hurt his back and he is wondering if he can have documentation that allows him to have lifting restrictions to protect his back.  He is also here to follow-up with his previous office visit.  He had blood work completed which was all within normal limits.  He had also complained at last office visit of symptoms of chest pain and palpitations especially after wrestling and playing with his younger sibling.  It sounded more like he was having bronchospasm related to his asthma, however I did refer him to cardiology at that time.  Unfortunately he has not heard from the cardiologist office to get appointment scheduled yet.  However, he tells me he has not had as many episodes of chest pain and palpitations since I saw him last.  Past Medical History:  Diagnosis Date   Delayed social  development    Delayed social development 09/02/2016   Learning disabilities    Unspecified asthma(493.90) 11/05/2012   Unspecified asthma(493.90) 11/05/2012      Family History  Problem Relation Age of Onset   Healthy Mother    Healthy Father    Healthy Maternal Grandmother    Cancer Maternal Grandfather     Social History   Social History Narrative   Lives with mother, father, sister and brother       Learning disabilities, 12th grade (2020 - 2021)       Social History   Tobacco Use   Smoking status: Never   Smokeless tobacco: Never  Substance Use Topics   Alcohol use: No     Current Meds  Medication Sig   acetaminophen (TYLENOL) 500 MG tablet Take 500 mg by mouth every 8 (eight) hours as needed.   albuterol (PROAIR HFA) 108 (90 Base) MCG/ACT inhaler INHALE 2 PUFFS INTO THE LUNGS EVERY 4-6 HOURS AS NEEDED FOR WHEEZING.   fluticasone (FLOVENT HFA) 220 MCG/ACT inhaler Inhale 2 puffs into the lungs 2 (two) times daily. INHALE 2 PUFFS TWICE DAILY AS DIRECTED.   ibuprofen (ADVIL) 400 MG tablet Take 400 mg by mouth every 8 (eight) hours as needed.   lidocaine (  LIDODERM) 5 % Place 1 patch onto the skin daily. Remove & Discard patch within 12 hours or as directed by MD   methocarbamol (ROBAXIN) 500 MG tablet Take 1 tablet (500 mg total) by mouth 2 (two) times daily.    ROS:  See HPI   Objective:   Today's Vitals: BP 94/64   Pulse 84   Temp (!) 97.3 F (36.3 C) (Temporal)   Ht 5\' 5"  (1.651 m)   Wt 118 lb 12.8 oz (53.9 kg)   SpO2 95%   BMI 19.77 kg/m  Vitals with BMI 02/08/2021 12/26/2020 09/16/2020  Height 5\' 5"  5\' 5"  5\' 3"   Weight 118 lbs 13 oz 117 lbs 3 oz 116 lbs 14 oz  BMI 19.77 19.5 20.71  Systolic 94 112 116  Diastolic 64 78 80  Pulse 84 74 78     Physical Exam Vitals reviewed.  Constitutional:      Appearance: Normal appearance.  HENT:     Head: Normocephalic and atraumatic.  Cardiovascular:     Rate and Rhythm: Normal rate and regular rhythm.   Pulmonary:     Effort: Pulmonary effort is normal.     Breath sounds: Normal breath sounds.  Musculoskeletal:     Cervical back: Normal and neck supple. Normal range of motion.     Thoracic back: Bony tenderness present. Decreased range of motion (right rotation).     Lumbar back: No tenderness. Decreased range of motion (forward flexion). Negative right straight leg raise test and negative left straight leg raise test.  Skin:    General: Skin is warm and dry.  Neurological:     Mental Status: He is alert and oriented to person, place, and time.  Psychiatric:        Mood and Affect: Mood normal.        Behavior: Behavior normal.        Thought Content: Thought content normal.        Judgment: Judgment normal.         Assessment and Plan   1. Chronic bilateral low back pain without sciatica      Plan: 1.  I will send him for x-rays for evaluation of his spine.  Recommended he take over-the-counter Tylenol 500 mg by mouth every 8 hours as needed for pain and then alternate this with ibuprofen if needed (400 mg by mouth every 8 hours as needed for pain).  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  May also consider sending him to the physical medicine rehab.  We also discussed proper body mechanics when picking up heavy objects from the ground.   Tests ordered Orders Placed This Encounter  Procedures   DG Thoracic Spine 2 View   DG Lumbar Spine Complete      No orders of the defined types were placed in this encounter.   Patient to follow-up in 6 months or sooner as needed.  , NP

## 2021-02-12 ENCOUNTER — Encounter: Payer: Self-pay | Admitting: Pediatrics

## 2021-02-13 NOTE — Telephone Encounter (Signed)
So after a call to the office and asking whom is referral personnel. He has a ppt now with Charlton Haws. Soon.

## 2021-02-15 ENCOUNTER — Other Ambulatory Visit: Payer: Self-pay

## 2021-02-15 ENCOUNTER — Ambulatory Visit (HOSPITAL_COMMUNITY)
Admission: RE | Admit: 2021-02-15 | Discharge: 2021-02-15 | Disposition: A | Discharge status: Home / Self Care | Payer: Medicaid Other | Source: Ambulatory Visit | Attending: Nurse Practitioner | Admitting: Nurse Practitioner

## 2021-02-15 ENCOUNTER — Emergency Department (HOSPITAL_COMMUNITY)
Admission: EM | Admit: 2021-02-15 | Discharge: 2021-02-15 | Disposition: A | Discharge status: Home / Self Care | Payer: Medicaid Other

## 2021-02-15 DIAGNOSIS — M545 Low back pain, unspecified: Secondary | ICD-10-CM | POA: Insufficient documentation

## 2021-02-15 DIAGNOSIS — G8929 Other chronic pain: Secondary | ICD-10-CM | POA: Insufficient documentation

## 2021-02-15 DIAGNOSIS — M546 Pain in thoracic spine: Secondary | ICD-10-CM | POA: Diagnosis not present

## 2021-02-20 ENCOUNTER — Telehealth (INDEPENDENT_AMBULATORY_CARE_PROVIDER_SITE_OTHER): Payer: Self-pay | Admitting: Nurse Practitioner

## 2021-02-20 DIAGNOSIS — M419 Scoliosis, unspecified: Secondary | ICD-10-CM

## 2021-02-20 DIAGNOSIS — G8929 Other chronic pain: Secondary | ICD-10-CM

## 2021-02-20 NOTE — Telephone Encounter (Signed)
Please call patient let him know that his x-rays do show that he has scoliosis.  Right now the treatment recommendation per myself and Dr. Karilyn Cota is to refer to physical therapy to see if they can assist with continue proper body mechanics when you have to lift heavy objects you are able to do so safely.  I will order referral to physical therapy today, please let me know if he has any questions or concerns.  Thank you.

## 2021-02-21 ENCOUNTER — Encounter (INDEPENDENT_AMBULATORY_CARE_PROVIDER_SITE_OTHER): Payer: Self-pay | Admitting: Nurse Practitioner

## 2021-02-21 ENCOUNTER — Telehealth (INDEPENDENT_AMBULATORY_CARE_PROVIDER_SITE_OTHER): Payer: Self-pay

## 2021-02-21 NOTE — Telephone Encounter (Signed)
Patient called and asked for a work note to restrict him from lifting anything heavy at his job and to please send to his MyChart so he can give to his work.

## 2021-02-21 NOTE — Telephone Encounter (Signed)
Patient came in office and was given message. Patient verbalized an understanding.

## 2021-02-21 NOTE — Telephone Encounter (Signed)
Called patient and gave him the message. I do see in the referral that the PT referral has been closed? Are you able to have someone look into this? Patient will go and has not been called to make an appointment.

## 2021-03-05 NOTE — Progress Notes (Signed)
CARDIOLOGY CONSULT NOTE       Patient ID: Aaron Carroll MRN: [id number] DOB/AGE: 2000/08/08 20 y.o.  Admit date: (Not on file) Referring Physician: Gosrani/Gray Primary Physician: Elenore Paddy, NP Primary Cardiologist: new Reason for Consultation: Palpitations  Active Problems:   * No active hospital problems. *   HPI:  20 y.o. with chronic back pain and scoliosis. History of asthma, learning disability and delayed social development Has had issues working at [employer] due to need to lift heavy boxes. He has had chest pain and palpitations Especially after wrestling and playing with his younger siblings. Palpitations worse sometimes with inhaler use Primary felt it Was more bronchospasm from asthma Symptoms have improved after initial referral No history of congenital heart issues, high risk family history , syncope  He has not smoked/vaped and denies excess ETOH or drugs   Labs including TSH have been normal   His pains have been going on for years. 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   Learning disabilities    Unspecified asthma(493.90) 11/05/2012   Unspecified asthma(493.90) 11/05/2012    Family History  Problem Relation Age of Onset   Healthy Mother    Healthy Father    Healthy Maternal Grandmother    Cancer Maternal Grandfather     Social History   Socioeconomic History   Marital status: Single    Spouse name: Not on file   Number of children: Not on file   Years of education: Not on file   Highest education level: Not on file  Occupational History   Occupation: Lobbyist: [employer]    Employer: Materials engineer  Tobacco Use   Smoking status: Never   Smokeless tobacco: Never  Vaping Use   Vaping Use: Never used   Substance and Sexual Activity   Alcohol use: No   Drug use: No   Sexual activity: Never  Other Topics Concern   Not on file  Social History Narrative   Lives with mother, father, sister and brother       Learning disabilities, 12th grade (2020 - 2021)       Social Determinants of Health   Financial Resource Strain: Not on file  Food Insecurity: Not on file  Transportation Needs: Not on file  Physical Activity: Not on file  Stress: Not on file  Social Connections: Not on file  Intimate Partner Violence: Not on file    No past surgical history on file.    Current Outpatient Medications:    acetaminophen (TYLENOL) 500 MG tablet, Take 500 mg by mouth every 8 (eight) hours as needed., Disp: , Rfl:    albuterol (PROAIR HFA) 108 (90 Base) MCG/ACT inhaler, INHALE 2 PUFFS INTO THE LUNGS EVERY 4-6 HOURS AS NEEDED FOR WHEEZING., Disp: 18 g, Rfl: 2   fluticasone (FLOVENT HFA) 220 MCG/ACT inhaler, Inhale 2 puffs into the lungs 2 (two) times daily. INHALE 2 PUFFS TWICE DAILY AS DIRECTED., Disp: 12 g, Rfl: 5   ibuprofen (ADVIL) 400 MG tablet, Take 400 mg by mouth every 8 (eight) hours as needed., Disp: , Rfl:    lidocaine (LIDODERM) 5 %, Place 1 patch onto the skin daily. Remove & Discard patch within 12 hours or as directed by MD, Disp: 30 patch, Rfl: 0  methocarbamol (ROBAXIN) 500 MG tablet, Take 1 tablet (500 mg total) by mouth 2 (two) times daily., Disp: 20 tablet, Rfl: 0    Physical Exam: There were no vitals taken for this visit.   Affect appropriate Healthy:  appears stated age HEENT: normal Neck supple with no adenopathy JVP normal no bruits no thyromegaly Lungs clear with no wheezing and good diaphragmatic motion Heart:  S1/S2 no murmur, no rub, gallop or click PMI normal Abdomen: benighn, BS positve, no tenderness, no AAA no bruit.  No HSM or HJR Distal pulses intact with no bruits No edema Neuro non-focal Skin warm and dry Scoliosis    Labs:   Lab Results   Component Value Date   WBC 5.7 12/26/2020   HGB 14.6 12/26/2020   HCT 45.2 12/26/2020   MCV 85.0 12/26/2020   PLT 268 12/26/2020   No results for input(s): NA, K, CL, CO2, BUN, CREATININE, CALCIUM, PROT, BILITOT, ALKPHOS, ALT, AST, GLUCOSE in the last 168 hours.  Invalid input(s): LABALBU No results found for: CKTOTAL, CKMB, CKMBINDEX, TROPONINI  Lab Results  Component Value Date   CHOL 135 12/26/2020   Lab Results  Component Value Date   HDL 45 12/26/2020   Lab Results  Component Value Date   LDLCALC 72 12/26/2020   Lab Results  Component Value Date   TRIG 98 12/26/2020   Lab Results  Component Value Date   CHOLHDL 3.0 12/26/2020   No results found for: LDLDIRECT    Radiology: DG Thoracic Spine 2 View  Result Date: 02/17/2021 CLINICAL DATA:  Back pain. EXAM: THORACIC SPINE 2 VIEWS COMPARISON:  None. FINDINGS: Curvature of the thoracic spine, apex to the right. The Cobb angle measured from the top of T1 through T12 is 13.5 degrees. No other malalignment. No fractures. No degenerative changes. IMPRESSION: Scoliotic curvature of the thoracic spine, apex to the right, with a Cobb angle of 13.5 degrees. No other abnormalities. Electronically Signed   By: Gerome Sam III M.D   On: 02/17/2021 18:10   DG Lumbar Spine Complete  Result Date: 02/17/2021 CLINICAL DATA:  Back pain. EXAM: LUMBAR SPINE - COMPLETE 4+ VIEW COMPARISON:  None. FINDINGS: There is curvature of the lumbar spine, apex to the left. The Cobb angle measured from the top of L1 through the bottom of L5 is approximately 14.9 degrees. No other malalignment. No fractures. No degenerative changes. IMPRESSION: Scoliotic curvature lumbar spine, apex to the left with a Cobb angle of 14.9 degrees. No fracture or degenerative change. Electronically Signed   By: Gerome Sam III M.D   On: 02/17/2021 18:11    EKG: 12/26/20 SR RSR' normal for age    ASSESSMENT AND PLAN:   Chest Pain: atypical normal ECG for age f/u  exercise stress echo  Palpitations: benign no need for monitor f/u echo to r/o structural heart issues given development delay and chest pain Scoliosis:  continue PT/OT xray 02/15/21 shows scoliotic curvature apex to left with Cobb angle of 15 degrees   Exercise stress echo  F/U PRN   Signed: Charlton Haws 03/05/2021, 4:55 PM

## 2021-03-06 ENCOUNTER — Ambulatory Visit: Payer: Medicaid Other | Admitting: Cardiovascular Disease

## 2021-03-12 ENCOUNTER — Ambulatory Visit (INDEPENDENT_AMBULATORY_CARE_PROVIDER_SITE_OTHER): Payer: Medicaid Other | Admitting: Cardiovascular Disease

## 2021-03-12 ENCOUNTER — Other Ambulatory Visit: Payer: Self-pay

## 2021-03-12 ENCOUNTER — Encounter: Payer: Self-pay | Admitting: Cardiovascular Disease

## 2021-03-12 VITALS — BP 110/66 | HR 84 | Ht 65.0 in | Wt 113.0 lb

## 2021-03-12 DIAGNOSIS — R079 Chest pain, unspecified: Secondary | ICD-10-CM

## 2021-03-12 DIAGNOSIS — R002 Palpitations: Secondary | ICD-10-CM | POA: Diagnosis not present

## 2021-03-12 DIAGNOSIS — M41115 Juvenile idiopathic scoliosis, thoracolumbar region: Secondary | ICD-10-CM

## 2021-03-12 NOTE — Patient Instructions (Signed)
Medication Instructions:  Your physician recommends that you continue on your current medications as directed. Please refer to the Current Medication list given to you today.  *If you need a refill on your cardiac medications before your next appointment, please call your pharmacy*   Lab Work: NONE   If you have labs (blood work) drawn today and your tests are completely normal, you will receive your results only by: MyChart Message (if you have MyChart) OR A paper copy in the mail If you have any lab test that is abnormal or we need to change your treatment, we will call you to review the results.   Testing/Procedures: Your physician has requested that you have a stress echocardiogram. For further information please visit [email or web address] Please follow instruction sheet as given.    Follow-Up: At [facility], you and your health needs are our priority.  As part of our continuing mission to provide you with exceptional heart care, we have created designated Provider Care Teams.  These Care Teams include your primary Cardiologist (physician) and Advanced Practice Providers (APPs -  Physician Assistants and Nurse Practitioners) who all work together to provide you with the care you need, when you need it.  We recommend signing up for the patient portal called "MyChart".  Sign up information is provided on this After Visit Summary.  MyChart is used to connect with patients for Virtual Visits (Telemedicine).  Patients are able to view lab/test results, encounter notes, upcoming appointments, etc.  Non-urgent messages can be sent to your provider as well.   To learn more about what you can do with MyChart, go to [email or web address].    Your next appointment:    As Needed   The format for your next appointment:   In Person  Provider:   Charlton Haws, MD   Other Instructions Thank you for choosing [facility]!

## 2021-04-03 ENCOUNTER — Inpatient Hospital Stay (HOSPITAL_COMMUNITY): Admission: RE | Admit: 2021-04-03 | Payer: Medicaid Other | Source: Ambulatory Visit

## 2021-04-18 ENCOUNTER — Ambulatory Visit (HOSPITAL_COMMUNITY): Admission: RE | Admit: 2021-04-18 | Payer: Medicaid Other | Source: Ambulatory Visit

## 2021-05-10 ENCOUNTER — Other Ambulatory Visit: Payer: Self-pay

## 2021-05-10 ENCOUNTER — Ambulatory Visit (HOSPITAL_COMMUNITY)
Admission: RE | Admit: 2021-05-10 | Discharge: 2021-05-10 | Disposition: A | Discharge status: Home / Self Care | Payer: Medicaid Other | Source: Ambulatory Visit | Attending: Cardiovascular Disease | Admitting: Cardiovascular Disease

## 2021-05-10 DIAGNOSIS — R002 Palpitations: Secondary | ICD-10-CM | POA: Diagnosis not present

## 2021-05-10 NOTE — Progress Notes (Signed)
*  PRELIMINARY RESULTS* Echocardiogram Echocardiogram Stress Test has been performed.  Stacey Drain 05/10/2021, 12:29 PM

## 2021-07-22 ENCOUNTER — Other Ambulatory Visit: Payer: Self-pay

## 2021-07-22 ENCOUNTER — Emergency Department (HOSPITAL_COMMUNITY): Payer: Medicaid Other

## 2021-07-22 ENCOUNTER — Emergency Department (HOSPITAL_COMMUNITY)
Admission: EM | Admit: 2021-07-22 | Discharge: 2021-07-22 | Disposition: A | Discharge status: Home / Self Care | Payer: Medicaid Other | Attending: Emergency Medicine | Admitting: Emergency Medicine

## 2021-07-22 ENCOUNTER — Encounter (HOSPITAL_COMMUNITY): Payer: Self-pay

## 2021-07-22 DIAGNOSIS — R0789 Other chest pain: Secondary | ICD-10-CM | POA: Insufficient documentation

## 2021-07-22 DIAGNOSIS — J45909 Unspecified asthma, uncomplicated: Secondary | ICD-10-CM | POA: Insufficient documentation

## 2021-07-22 DIAGNOSIS — Z7951 Long term (current) use of inhaled steroids: Secondary | ICD-10-CM | POA: Diagnosis not present

## 2021-07-22 DIAGNOSIS — R079 Chest pain, unspecified: Secondary | ICD-10-CM | POA: Diagnosis not present

## 2021-07-22 DIAGNOSIS — R140 Abdominal distension (gaseous): Secondary | ICD-10-CM | POA: Insufficient documentation

## 2021-07-22 DIAGNOSIS — M25512 Pain in left shoulder: Secondary | ICD-10-CM | POA: Diagnosis not present

## 2021-07-22 LAB — CBC
HCT: 45.8 % (ref 39.0–52.0)
Hemoglobin: 14.9 g/dL (ref 13.0–17.0)
MCH: 28.6 pg (ref 26.0–34.0)
MCHC: 32.5 g/dL (ref 30.0–36.0)
MCV: 87.9 fL (ref 80.0–100.0)
Platelets: 241 10*3/uL (ref 150–400)
RBC: 5.21 MIL/uL (ref 4.22–5.81)
RDW: 12.2 % (ref 11.5–15.5)
WBC: 6.5 10*3/uL (ref 4.0–10.5)
nRBC: 0 % (ref 0.0–0.2)

## 2021-07-22 LAB — BASIC METABOLIC PANEL
Anion gap: 9 (ref 5–15)
BUN: 13 mg/dL (ref 6–20)
CO2: 26 mmol/L (ref 22–32)
Calcium: 8.9 mg/dL (ref 8.9–10.3)
Chloride: 104 mmol/L (ref 98–111)
Creatinine, Ser: 0.95 mg/dL (ref 0.61–1.24)
GFR, Estimated: >60 mL/min (ref >60)
Glucose, Bld: 87 mg/dL (ref 70–99)
Potassium: 3.6 mmol/L (ref 3.5–5.1)
Sodium: 139 mmol/L (ref 135–145)

## 2021-07-22 LAB — TROPONIN I (HIGH SENSITIVITY): Troponin I (High Sensitivity): 3 ng/L (ref <18)

## 2021-07-22 NOTE — Discharge Instructions (Signed)
The test today did not show any serious problems.  Use the resource guide which is attached, to help you find a primary care doctor for further care and treatment as needed.

## 2021-07-22 NOTE — ED Triage Notes (Signed)
Pt presents with chest pain and tightness x2 weeks. Pt states that he has a history of asthma. Denies any sickness. Describes pain as sharp and located of left side that is constant.

## 2021-07-22 NOTE — ED Provider Notes (Signed)
[facility] EMERGENCY DEPARTMENT Provider Note   CSN: [id number] Arrival date & time: 07/22/21  1935     History Chief Complaint  Patient presents with   Chest Pain    Aaron Carroll is a 20 y.o. male.  HPI He is here for evaluation of ongoing left upper chest and left shoulder pain, for 2 weeks.  Has a history of similar problems in the past and saw cardiology and had a echocardiogram done, 2 months ago.  Documentation in the EMR indicates that the stress echo was normal.  The patient denies diaphoresis, shortness of breath, cough, weakness or dizziness.  He has an albuterol inhaler and sometimes uses it for asthma.  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 No pertinent surgical history.     Family History  Problem Relation Age of Onset   Healthy Mother    Healthy Father    Healthy Sister    Healthy Sister    Healthy Sister    Healthy Sister    Healthy Brother    Healthy Maternal Grandmother    Cancer Maternal Grandfather     Social History   Tobacco Use   Smoking status: Never   Smokeless tobacco: Never  Vaping Use   Vaping Use: Never used  Substance Use Topics   Alcohol use: No   Drug use: No    Home Medications Prior to Admission medications   Medication Sig Start Date End Date Taking? Authorizing Provider  acetaminophen (TYLENOL) 500 MG tablet Take 500 mg by mouth every 8 (eight) hours as needed.    [provider]  albuterol (PROAIR HFA) 108 (90 Base) MCG/ACT inhaler INHALE 2 PUFFS  INTO THE LUNGS EVERY 4-6 HOURS AS NEEDED FOR WHEEZING. 05/11/20   Rosiland Oz, MD  fluticasone (FLOVENT HFA) 220 MCG/ACT inhaler Inhale 2 puffs into the lungs 2 (two) times daily. INHALE 2 PUFFS TWICE DAILY AS DIRECTED. 05/11/20   Rosiland Oz, MD  ibuprofen (ADVIL) 400 MG tablet Take 400 mg by mouth every 8 (eight) hours as needed.    [provider]  lidocaine (LIDODERM) 5 % Place 1 patch onto the skin daily. Remove & Discard patch within 12 hours or as directed by MD 09/16/20   Gailen Shelter, PA  methocarbamol (ROBAXIN) 500 MG tablet Take 1 tablet (500 mg total) by mouth 2 (two) times daily. 09/16/20   Gailen Shelter, PA    Allergies    Patient has no known allergies.  Review of Systems   Review of Systems  All other systems reviewed and are negative.  Physical Exam Updated Vital Signs BP 110/76    Pulse 68    Temp 98.6 F (37 C)    Resp 16    Ht 5\' 5"  (1.651 m)    Wt 59 kg    SpO2 100%    BMI 21.63 kg/m   Physical Exam Vitals and nursing note reviewed.  Constitutional:      General: He is not in acute distress.  Appearance: He is well-developed. He is not ill-appearing, toxic-appearing or diaphoretic.  HENT:     Head: Normocephalic and atraumatic.     Right Ear: External ear normal.     Left Ear: External ear normal.  Eyes:     Conjunctiva/sclera: Conjunctivae normal.     Pupils: Pupils are equal, round, and reactive to light.  Neck:     Trachea: Phonation normal.  Cardiovascular:     Rate and Rhythm: Normal rate and regular rhythm.     Heart sounds: Normal heart sounds.  Pulmonary:     Effort: Pulmonary effort is normal.     Breath sounds: Normal breath sounds.  Chest:     Chest wall: No tenderness.  Abdominal:     General: There is distension.     Palpations: Abdomen is soft.     Tenderness: There is no abdominal tenderness.  Musculoskeletal:        General: Normal range of motion.     Cervical back: Normal range of motion and neck  supple.  Skin:    General: Skin is warm and dry.  Neurological:     Mental Status: He is alert and oriented to person, place, and time.     Cranial Nerves: No cranial nerve deficit.     Sensory: No sensory deficit.     Motor: No abnormal muscle tone.     Coordination: Coordination normal.  Psychiatric:        Mood and Affect: Mood normal.        Behavior: Behavior normal.        Thought Content: Thought content normal.        Judgment: Judgment normal.    ED Results / Procedures / Treatments   Labs (all labs ordered are listed, but only abnormal results are displayed) Labs Reviewed  BASIC METABOLIC PANEL  CBC  TROPONIN I (HIGH SENSITIVITY)    EKG EKG Interpretation  Date/Time:  Sunday July 22 2021 19:45:33 EST Ventricular Rate:  83 PR Interval:  116 QRS Duration: 74 QT Interval:  344 QTC Calculation: 404 R Axis:   49 Text Interpretation: Normal sinus rhythm with sinus arrhythmia Right atrial enlargement Borderline ECG since last tracing no significant change Confirmed by Mancel Bale [phone]) on 07/22/2021 8:38:13 PM  Radiology DG Chest 2 View  Result Date: 07/22/2021 CLINICAL DATA:  Left chest pain and tightness. EXAM: CHEST - 2 VIEW COMPARISON:  Chest radiograph 10/18/2015 FINDINGS: The cardiomediastinal contours are within normal limits. The lungs are clear. No pneumothorax or pleural effusion. No acute finding in the visualized skeleton. IMPRESSION: No acute cardiopulmonary finding. Electronically Signed   By: Emmaline Kluver M.D.   On: 07/22/2021 20:22    Procedures Procedures   Medications Ordered in ED Medications - No data to display  ED Course  I have reviewed the triage vital signs and the nursing notes.  Pertinent labs & imaging results that were available during my care of the patient were reviewed by me and considered in my medical decision making (see chart for details).    MDM Rules/Calculators/A&P                          Patient Vitals  for the past 24 hrs:  BP Temp Pulse Resp SpO2 Height Weight  07/22/21 2100 110/76 -- 68 16 100 % -- --  07/22/21 2030 107/88 -- 74 18 97 % -- --  07/22/21 2015 100/78 -- 75 16 100 % -- --  07/22/21 1945 120/88 98.6 F (37 C) 80 18 100 % 5\' 5"  (1.651 m) 59 kg    At the time of discharge- reevaluation with update and discussion. After initial assessment and treatment, an updated evaluation reveals he remains comfortable and has no further complaint.   Medical Decision Making:  This patient is presenting for evaluation of chest pain, which does require a range of treatment options, and is a complaint that involves a moderate risk of morbidity and mortality. The differential diagnoses include ACS, chest wall disorder, pulmonary disorder. I decided to review old records, and in summary Young male, with ongoing chest pain, previously evaluated by cardiology, and discharged.  I did not require additional historical information from anyone.  Clinical Laboratory Tests Ordered, included CBC, Metabolic panel, and troponin . Review indicates normal. Radiologic Tests Ordered, included chest x-ray.  I independently Visualized: Radiograph images, which show no infiltrate or edema  Cardiac Monitor Tracing which shows normal sinus rhythm    Critical Interventions-clinical evaluation, laboratory testing, radiography, observation and reassessment  After These Interventions, the Patient was reevaluated and was found safe for discharge.  Nonspecific symptoms with reassuring evaluation.  He has also had a recent cardiac stress echo which was normal.  There is no indication for further ED intervention or hospitalization at this time.  CRITICAL CARE-no Performed by: Mancel Bale  Nursing Notes Reviewed/ Care Coordinated Applicable Imaging Reviewed Interpretation of Laboratory Data incorporated into ED treatment  The patient appears reasonably screened and/or stabilized for discharge and I  doubt any other medical condition or other [facility] requiring further screening, evaluation, or treatment in the ED at this time prior to discharge.  Plan: Home Medications-continue usual medication; Home Treatments-gradual advance diet and activity; return here if the recommended treatment, does not improve the symptoms; Recommended follow up-PCP, as needed.        Final Clinical Impression(s) / ED Diagnoses Final diagnoses:  Nonspecific chest pain    Rx / DC Orders ED Discharge Orders     None        [facility], MD 07/22/21 2305

## 2021-07-23 ENCOUNTER — Telehealth: Payer: Self-pay

## 2021-07-23 NOTE — Telephone Encounter (Signed)
Transition Care Management Unsuccessful Follow-up Telephone Call  Date of discharge and from where:  07/22/2021-[place]   Attempts:  1st Attempt  Reason for unsuccessful TCM follow-up call:  Voice mail full

## 2021-07-24 NOTE — Telephone Encounter (Signed)
Transition Care Management Unsuccessful Follow-up Telephone Call  Date of discharge and from where:  07/22/2021 from [facility]  Attempts:  2nd Attempt  Reason for unsuccessful TCM follow-up call:  Left voice message

## 2021-07-25 NOTE — Telephone Encounter (Signed)
Transition Care Management Follow-up Telephone Call Date of discharge and from where: 07/22/2021 from [facility] How have you been since you were released from the hospital? Pt stated that he is still having some chest pain. Pt stated that he was est with Dr. Karilyn Cota before his untimely passing. I did inform patient to reach out to [facility] for a possible appt in the near future. Forwarding to [employer].  Any questions or concerns? No  Items Reviewed: Did the pt receive and understand the discharge instructions provided? Yes  Medications obtained and verified? Yes  Other? No  Any new allergies since your discharge? No  Dietary orders reviewed? No Do you have support at home? Yes   Functional Questionnaire: (I = Independent and D = Dependent) ADLs: I  Bathing/Dressing- I  Meal Prep- I  Eating- I  Maintaining continence- I  Transferring/Ambulation- I  Managing Meds- I   Follow up appointments reviewed:  PCP Hospital f/u appt confirmed? No   Specialist Hospital f/u appt confirmed? No   Are transportation arrangements needed? No  If their condition worsens, is the pt aware to call PCP or go to the Emergency Dept.? Yes Was the patient provided with contact information for the PCP's office or ED? Yes Was to pt encouraged to call back with questions or concerns? Yes

## 2021-08-01 DIAGNOSIS — Z0279 Encounter for issue of other medical certificate: Secondary | ICD-10-CM

## 2021-08-14 ENCOUNTER — Ambulatory Visit (INDEPENDENT_AMBULATORY_CARE_PROVIDER_SITE_OTHER): Payer: Medicaid Other | Admitting: Internal Medicine

## 2021-08-20 ENCOUNTER — Encounter (HOSPITAL_COMMUNITY): Payer: Self-pay | Admitting: *Deleted

## 2021-08-20 ENCOUNTER — Emergency Department (HOSPITAL_COMMUNITY)
Admission: EM | Admit: 2021-08-20 | Discharge: 2021-08-20 | Disposition: A | Discharge status: Home / Self Care | Payer: Medicaid Other | Attending: Emergency Medicine | Admitting: Emergency Medicine

## 2021-08-20 DIAGNOSIS — Z7951 Long term (current) use of inhaled steroids: Secondary | ICD-10-CM | POA: Diagnosis not present

## 2021-08-20 DIAGNOSIS — R1031 Right lower quadrant pain: Secondary | ICD-10-CM | POA: Diagnosis not present

## 2021-08-20 DIAGNOSIS — J45909 Unspecified asthma, uncomplicated: Secondary | ICD-10-CM | POA: Diagnosis not present

## 2021-08-20 DIAGNOSIS — G8929 Other chronic pain: Secondary | ICD-10-CM | POA: Diagnosis not present

## 2021-08-20 HISTORY — DX: Unspecified asthma, uncomplicated: J45.909

## 2021-08-20 LAB — CBC
HCT: 46.9 % (ref 39.0–52.0)
Hemoglobin: 14.9 g/dL (ref 13.0–17.0)
MCH: 27.7 pg (ref 26.0–34.0)
MCHC: 31.8 g/dL (ref 30.0–36.0)
MCV: 87.2 fL (ref 80.0–100.0)
Platelets: 244 10*3/uL (ref 150–400)
RBC: 5.38 MIL/uL (ref 4.22–5.81)
RDW: 12.3 % (ref 11.5–15.5)
WBC: 6.9 10*3/uL (ref 4.0–10.5)
nRBC: 0 % (ref 0.0–0.2)

## 2021-08-20 LAB — COMPREHENSIVE METABOLIC PANEL
ALT: 18 U/L (ref 0–44)
AST: 23 U/L (ref 15–41)
Albumin: 5 g/dL (ref 3.5–5.0)
Alkaline Phosphatase: 62 U/L (ref 38–126)
Anion gap: 9 (ref 5–15)
BUN: 12 mg/dL (ref 6–20)
CO2: 27 mmol/L (ref 22–32)
Calcium: 9.2 mg/dL (ref 8.9–10.3)
Chloride: 103 mmol/L (ref 98–111)
Creatinine, Ser: 0.89 mg/dL (ref 0.61–1.24)
GFR, Estimated: >60 mL/min (ref >60)
Glucose, Bld: 84 mg/dL (ref 70–99)
Potassium: 3.5 mmol/L (ref 3.5–5.1)
Sodium: 139 mmol/L (ref 135–145)
Total Bilirubin: 0.7 mg/dL (ref 0.3–1.2)
Total Protein: 8.1 g/dL (ref 6.5–8.1)

## 2021-08-20 LAB — LIPASE, BLOOD: Lipase: 31 U/L (ref 11–51)

## 2021-08-20 LAB — URINALYSIS, ROUTINE W REFLEX MICROSCOPIC
Bilirubin Urine: NEGATIVE
Glucose, UA: NEGATIVE mg/dL
Hgb urine dipstick: NEGATIVE
Ketones, ur: NEGATIVE mg/dL
Leukocytes,Ua: NEGATIVE
Nitrite: NEGATIVE
Protein, ur: NEGATIVE mg/dL
Specific Gravity, Urine: 1.015 (ref 1.005–1.030)
pH: 6 (ref 5.0–8.0)

## 2021-08-20 NOTE — ED Triage Notes (Signed)
Pain in right lower quadrant for the past week, states pain started 2 years ago and it getting worse, denies nausea or vomiting

## 2021-08-20 NOTE — ED Provider Notes (Signed)
[facility] EMERGENCY DEPARTMENT Provider Note   CSN: [id number] Arrival date & time: 08/20/21  1814     History Chief Complaint  Patient presents with   Abdominal Pain    Aaron Carroll is a 21 y.o. male presents the emergency department for evaluation of chronic abdominal pain in the right lower quadrant.  Patient reports he has had constant, unchanged right lower quadrant pain for the past 3 years.  He denies any nausea, vomiting, diarrhea, constipation.  Denies any dysuria or hematuria.  Denies any testicular, penile, or scrotal involvement.  He denies any trauma to the area.  He denies any exacerbating relieving factors. The patient has been seen here several times over this timeframe for reevaluation and negative work-up.  He reports he was told to follow-up with urology and gastroenterology but is waiting on callback.  He reports a medical history of asthma, denies any surgeries.  No known drug allergies.  No daily medications.  Denies any tobacco, EtOH, illicit drug use ever.   Abdominal Pain Associated symptoms: no chest pain, no chills, no constipation, no cough, no diarrhea, no dysuria, no fever, no hematuria, no nausea, no shortness of breath, no sore throat and no vomiting       Home Medications Prior to Admission medications   Medication Sig Start Date End Date Taking? Authorizing Provider  acetaminophen (TYLENOL) 500 MG tablet Take 500 mg by mouth every 8 (eight) hours as needed.    [provider]  albuterol (PROAIR HFA) 108 (90 Base) MCG/ACT inhaler INHALE 2 PUFFS INTO THE LUNGS EVERY 4-6 HOURS AS NEEDED FOR WHEEZING. 05/11/20   Rosiland Oz, MD  fluticasone (FLOVENT HFA) 220 MCG/ACT inhaler Inhale 2 puffs into the lungs 2 (two) times daily. INHALE 2 PUFFS TWICE DAILY AS DIRECTED. 05/11/20   Rosiland Oz, MD  ibuprofen (ADVIL) 400 MG tablet Take 400 mg by mouth every 8 (eight) hours as needed.    [provider]  lidocaine (LIDODERM) 5 %  Place 1 patch onto the skin daily. Remove & Discard patch within 12 hours or as directed by MD 09/16/20   Gailen Shelter, PA  methocarbamol (ROBAXIN) 500 MG tablet Take 1 tablet (500 mg total) by mouth 2 (two) times daily. 09/16/20   Gailen Shelter, PA      Allergies    Patient has no known allergies.    Review of Systems   Review of Systems  Constitutional:  Negative for chills and fever.  HENT:  Negative for ear pain and sore throat.   Eyes:  Negative for pain and visual disturbance.  Respiratory:  Negative for cough and shortness of breath.   Cardiovascular:  Negative for chest pain and palpitations.  Gastrointestinal:  Positive for abdominal pain. Negative for blood in stool, constipation, diarrhea, nausea and vomiting.  Genitourinary:  Negative for dysuria, hematuria, penile discharge, penile pain, penile swelling, scrotal swelling and testicular pain.  Musculoskeletal:  Negative for arthralgias and back pain.  Skin:  Negative for color change and rash.  Neurological:  Negative for seizures and syncope.  All other systems reviewed and are negative.  Physical Exam Updated Vital Signs BP 124/87    Pulse 67    Temp 97.9 F (36.6 C)    Resp 18    Wt 51.7 kg    SpO2 97%    BMI 18.97 kg/m  Physical Exam Vitals and nursing note reviewed.  Constitutional:      General: He is not in  acute distress.    Appearance: Normal appearance. He is not toxic-appearing.  HENT:     Head: Normocephalic and atraumatic.  Eyes:     General: No scleral icterus. Cardiovascular:     Rate and Rhythm: Normal rate and regular rhythm.     Heart sounds: No murmur heard. Pulmonary:     Effort: Pulmonary effort is normal. No respiratory distress.     Breath sounds: Normal breath sounds.  Abdominal:     General: Abdomen is flat. Bowel sounds are normal.     Palpations: Abdomen is soft.     Tenderness: There is no abdominal tenderness. There is no right CVA tenderness, left CVA tenderness, guarding or  rebound. Negative signs include Murphy's sign and Rovsing's sign.     Comments: No overlying skin changes, erythema, ecchymosis, abrasion, or rash noted to the abdomen.  No palpable masses or hernias.  Mild point tenderness of the right lower quadrant the patient does not react with touch.  Musculoskeletal:        General: No deformity.     Cervical back: Normal range of motion.  Skin:    General: Skin is warm and dry.  Neurological:     General: No focal deficit present.     Mental Status: He is alert. Mental status is at baseline.    ED Results / Procedures / Treatments   Labs (all labs ordered are listed, but only abnormal results are displayed) Labs Reviewed  LIPASE, BLOOD  COMPREHENSIVE METABOLIC PANEL  CBC  URINALYSIS, ROUTINE W REFLEX MICROSCOPIC    EKG None  Radiology No results found.  Procedures Procedures    Medications Ordered in ED Medications - No data to display  ED Course/ Medical Decision Making/ A&P                           Medical Decision Making Amount and/or Complexity of Data Reviewed Labs: ordered.   21 year old male presents emerged part for evaluation of chronic, constant, unchanged right lower quadrant abdominal pain.  Physical exam unremarkable.  Patient is sitting texting on his phone in no acute distress.  Vital signs stable.  Patient normotensive, afebrile, satting 97% on room air.  Labs ordered in triage.  I independently interpreted and reviewed the patient's labs.  Urinalysis unremarkable.  Lipase normal.  CBC without leukocytosis or anemia.  CMP shows no electrolyte abnormalities.  Normal LFTs.  Normal GFR.  On prior chart review, patient has been seen in the ED several times for the same pain with normal work-up.  He received a abdominal CT scan on 03-14-2020 that was normal other than an incidental finding of a prostatic cyst.  In the beginning of 2022 he reports he was following up with [place], but still has not.  He has had appointments  with urology and gastroenterology, but is awaiting a call back and is still not seeing them.  I discussed with him that he will need to make that phone call and that they will not reach out to him to schedule an appointment.  He has been seen and evaluated by his primary care doctor for this pain and was into physical therapy although he has not showed up for scheduled appointments 3 times.  Given the patient's unchanged pain for the past 3 years along with a no new symptoms or worsening, I have a low suspicion for this being anything emergent given the chronicity and unchanged pain.  The patient's  labs are unremarkable and benign.  He reports to me he was seen here to see a urologist and gastroenterologist.  I discussed with him that he will need to follow-up these outpatient, as we do not have the services for nonemergent ER patients.  I added referral to urology and gastroenterology on the discharge paperwork.  I recommended that he follow-up with his PCP to see if he can get back on physical therapy.  I stressed with him that he will need to follow-up with these specialties and will need to call them to make an appointment.  Patient agrees with plan.  Strict return precautions given.  Patient is stable and being discharged home in good condition.  I discussed this case with my attending physician who cosigned this note including patient's presenting symptoms, physical exam, and planned diagnostics and interventions. Attending physician stated agreement with plan or made changes to plan which were implemented.    Final Clinical Impression(s) / ED Diagnoses Final diagnoses:  Right lower quadrant abdominal pain    Rx / DC Orders ED Discharge Orders     None         Sherrell Puller, PA-C 08/22/21 1513    Godfrey Pick, MD 08/23/21 1958

## 2021-08-20 NOTE — Discharge Instructions (Addendum)
You were seen here today for evaluation of your chronic lower right sided abdominal pain. Your lab work was normal. You need to follow through with your referrals to urology and gastroenterology as previously mentioned to you. I have listed the information for these on this paperwork. Please call to schedule an appointment with them soon. Follow up with your PCP this week. If you have any worsening abdominal pain, nausea, vomiting, diarrhea, constipation, pain on urination or blood in urine please return to the ED for re-evaluation.

## 2021-08-21 ENCOUNTER — Telehealth: Payer: Self-pay

## 2021-08-21 NOTE — Telephone Encounter (Signed)
Transition Care Management Follow-up Telephone Call Date of discharge and from where: 08/20/2021 from [facility] How have you been since you were released from the hospital? Pt stated that he is still having the same pain. Pt stated that eating can make his abdominal pain worse.  Any questions or concerns? No  Items Reviewed: Did the pt receive and understand the discharge instructions provided? Yes  Medications obtained and verified? Yes  Other? No  Any new allergies since your discharge? No  Dietary orders reviewed? No Do you have support at home? Yes   Functional Questionnaire: (I = Independent and D = Dependent) ADLs: I  Bathing/Dressing- I  Meal Prep- I  Eating- I  Maintaining continence- I  Transferring/Ambulation- I  Managing Meds- I   Follow up appointments reviewed:  PCP Hospital f/u appt confirmed? No   Specialist Hospital f/u appt confirmed? No  Recommended to follow up GI and Urology. Pt is waiting for a call back.  Are transportation arrangements needed? No  If their condition worsens, is the pt aware to call PCP or go to the Emergency Dept.? Yes Was the patient provided with contact information for the PCP's office or ED? Yes Was to pt encouraged to call back with questions or concerns? Yes

## 2021-12-06 ENCOUNTER — Ambulatory Visit (INDEPENDENT_AMBULATORY_CARE_PROVIDER_SITE_OTHER): Payer: Medicaid Other

## 2021-12-06 ENCOUNTER — Encounter: Payer: Self-pay | Admitting: *Deleted

## 2021-12-06 ENCOUNTER — Ambulatory Visit
Admission: EM | Admit: 2021-12-06 | Discharge: 2021-12-06 | Disposition: A | Discharge status: Home / Self Care | Payer: Medicaid Other | Attending: Nurse Practitioner | Admitting: Nurse Practitioner

## 2021-12-06 DIAGNOSIS — M79662 Pain in left lower leg: Secondary | ICD-10-CM | POA: Diagnosis not present

## 2021-12-06 MED ORDER — IBUPROFEN 600 MG PO TABS: 600.0000 mg | ORAL_TABLET | Freq: Four times a day (QID) | ORAL | 0 refills | Status: DC | PRN

## 2021-12-06 NOTE — ED Triage Notes (Signed)
Pt reports long h/o LLE pain. Today, he notes increased left anterior shin pain increasing within the last 2wks. Has been taking aleve w/o relief. No swelling or bruising. ?

## 2021-12-06 NOTE — Discharge Instructions (Signed)
Your x-rays are negative for any fracture.  There is no swelling of your soft tissue in your lower leg. ?Take medication as prescribed. ?May apply ice as needed to help with any pain. ?If your symptoms continue to worsen and do not improve, I recommend following up with orthopedics.  I am providing you information for Ortho care at [facility] for you to follow-up.  He will need to call and make an appointment. ?Follow up as needed. ?
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
## 2021-12-13 ENCOUNTER — Ambulatory Visit: Payer: Medicaid Other | Admitting: Orthopedic Surgery

## 2021-12-24 ENCOUNTER — Ambulatory Visit (INDEPENDENT_AMBULATORY_CARE_PROVIDER_SITE_OTHER): Payer: Medicaid Other | Admitting: Orthopedic Surgery

## 2021-12-24 ENCOUNTER — Encounter: Payer: Self-pay | Admitting: Orthopedic Surgery

## 2021-12-24 VITALS — BP 115/78 | HR 92 | Ht 65.0 in | Wt 121.0 lb

## 2021-12-24 DIAGNOSIS — M79605 Pain in left leg: Secondary | ICD-10-CM

## 2021-12-24 NOTE — Patient Instructions (Addendum)
Go to physical therapy for 6 weeks, then follow up with Dr. Romeo Apple  Physical therapy has been ordered for you at [facility]. They should call you to schedule, [phone] is the phone number to call, if you want to call to schedule.
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
## 2022-01-02 ENCOUNTER — Ambulatory Visit (HOSPITAL_COMMUNITY): Payer: Medicaid Other | Attending: Orthopedic Surgery
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
## 2022-01-03 ENCOUNTER — Ambulatory Visit (HOSPITAL_COMMUNITY): Payer: Medicaid Other

## 2022-01-04 DIAGNOSIS — H5213 Myopia, bilateral: Secondary | ICD-10-CM | POA: Diagnosis not present

## 2022-01-08 DIAGNOSIS — F17210 Nicotine dependence, cigarettes, uncomplicated: Secondary | ICD-10-CM | POA: Diagnosis not present

## 2022-01-08 DIAGNOSIS — I10 Essential (primary) hypertension: Secondary | ICD-10-CM | POA: Diagnosis not present

## 2022-01-08 DIAGNOSIS — L209 Atopic dermatitis, unspecified: Secondary | ICD-10-CM | POA: Diagnosis not present

## 2022-01-08 DIAGNOSIS — Z681 Body mass index (BMI) 19 or less, adult: Secondary | ICD-10-CM | POA: Diagnosis not present

## 2022-01-08 DIAGNOSIS — J45998 Other asthma: Secondary | ICD-10-CM | POA: Diagnosis not present

## 2022-02-11 ENCOUNTER — Ambulatory Visit: Payer: Medicaid Other | Admitting: Orthopedic Surgery

## 2022-02-11 ENCOUNTER — Encounter: Payer: Self-pay | Admitting: Orthopedic Surgery

## 2022-04-19 ENCOUNTER — Emergency Department (HOSPITAL_COMMUNITY)
Admission: EM | Admit: 2022-04-19 | Discharge: 2022-04-19 | Discharge status: Against Medical Advice | Payer: Medicaid Other | Source: Home / Self Care

## 2022-07-16 ENCOUNTER — Emergency Department (HOSPITAL_COMMUNITY)
Admission: EM | Admit: 2022-07-16 | Discharge: 2022-07-16 | Disposition: A | Discharge status: Home / Self Care | Payer: Medicaid Other | Attending: Emergency Medicine | Admitting: Emergency Medicine

## 2022-07-16 ENCOUNTER — Emergency Department (HOSPITAL_COMMUNITY): Payer: Medicaid Other

## 2022-07-16 ENCOUNTER — Encounter (HOSPITAL_COMMUNITY): Payer: Self-pay

## 2022-07-16 DIAGNOSIS — J45909 Unspecified asthma, uncomplicated: Secondary | ICD-10-CM | POA: Diagnosis not present

## 2022-07-16 DIAGNOSIS — R079 Chest pain, unspecified: Secondary | ICD-10-CM | POA: Diagnosis not present

## 2022-07-16 DIAGNOSIS — R059 Cough, unspecified: Secondary | ICD-10-CM | POA: Diagnosis not present

## 2022-07-16 DIAGNOSIS — R000 Tachycardia, unspecified: Secondary | ICD-10-CM | POA: Diagnosis not present

## 2022-07-16 DIAGNOSIS — R52 Pain, unspecified: Secondary | ICD-10-CM | POA: Diagnosis not present

## 2022-07-16 DIAGNOSIS — R0789 Other chest pain: Secondary | ICD-10-CM | POA: Diagnosis not present

## 2022-07-16 LAB — BASIC METABOLIC PANEL
Anion gap: 9 (ref 5–15)
BUN: 8 mg/dL (ref 6–20)
CO2: 27 mmol/L (ref 22–32)
Calcium: 9.1 mg/dL (ref 8.9–10.3)
Chloride: 106 mmol/L (ref 98–111)
Creatinine, Ser: 0.94 mg/dL (ref 0.61–1.24)
GFR, Estimated: >60 mL/min (ref >60)
Glucose, Bld: 83 mg/dL (ref 70–99)
Potassium: 3.6 mmol/L (ref 3.5–5.1)
Sodium: 142 mmol/L (ref 135–145)

## 2022-07-16 LAB — CBC
HCT: 43.5 % (ref 39.0–52.0)
Hemoglobin: 13.9 g/dL (ref 13.0–17.0)
MCH: 27.8 pg (ref 26.0–34.0)
MCHC: 32 g/dL (ref 30.0–36.0)
MCV: 87 fL (ref 80.0–100.0)
Platelets: 236 10*3/uL (ref 150–400)
RBC: 5 MIL/uL (ref 4.22–5.81)
RDW: 12.3 % (ref 11.5–15.5)
WBC: 5.2 10*3/uL (ref 4.0–10.5)
nRBC: 0 % (ref 0.0–0.2)

## 2022-07-16 LAB — TROPONIN I (HIGH SENSITIVITY): Troponin I (High Sensitivity): 4 ng/L (ref <18)

## 2022-07-16 MED ORDER — NAPROXEN 250 MG PO TABS
500.0000 mg | ORAL_TABLET | Freq: Once | ORAL | Status: AC
  Administered 2022-07-16: 500 mg via ORAL
  Filled 2022-07-16: qty 2

## 2022-07-16 MED ORDER — NAPROXEN 500 MG PO TABS: 500.0000 mg | ORAL_TABLET | Freq: Two times a day (BID) | ORAL | 0 refills | Status: DC

## 2022-07-16 NOTE — ED Triage Notes (Signed)
Pt BIBA from work . Pt c/o chest pain x several days with inspiration and palpation. Pt states non-productive cough.   HR 103 112/62 98%

## 2022-07-16 NOTE — Discharge Instructions (Signed)
You were evaluated in the Emergency Department and after careful evaluation, we did not find any emergent condition requiring admission or further testing in the hospital.  Your exam/testing today is overall reassuring.  Take the Naprosyn anti-inflammatory twice daily as needed for pain.  Please return to the Emergency Department if you experience any worsening of your condition.   Thank you for allowing [place] to be a part of your care.

## 2022-07-16 NOTE — ED Provider Notes (Signed)
[facility] Emergency Department Provider Note MRN:  [id number]  Arrival date & time: 07/16/22     Chief Complaint   Chest Pain   History of Present Illness   Aaron Carroll is a 21 y.o. year-old male with a history of asthma presenting to the ED with chief complaint of chest pain.  Pain to the left side of the chest for the past 5 days, worse with palpation, worse with movements of the left arm.  Denies shortness of breath, no leg pain or swelling, no fever, mild cough.  Review of Systems  A thorough review of systems was obtained and all systems are negative except as noted in the HPI and PMH.   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 No pertinent surgical history.  Family History  Problem Relation Age of Onset   Healthy Mother    Healthy Father    Healthy Sister    Healthy Sister    Healthy Sister    Healthy Sister    Healthy Brother    Healthy Maternal Grandmother    Cancer Maternal Grandfather     Social History   Socioeconomic History   Marital status: Single    Spouse name: Not on file   Number of children: Not on file   Years of education: Not on file   Highest education level: Not on file  Occupational History   Occupation: Lobbyist: [employer]    Employer: Materials engineer  Tobacco Use   Smoking status: Never   Smokeless tobacco: Never  Vaping Use   Vaping Use: Never used  Substance and Sexual Activity   Alcohol use: No   Drug use: No   Sexual activity: Never  Other Topics Concern   Not on file  Social History Narrative   Lives with mother, father, sister and brother       Learning disabilities, 12th grade (2020 - 2021)       Social Determinants of Health    Financial Resource Strain: Not on file  Food Insecurity: Not on file  Transportation Needs: Not on file  Physical Activity: Not on file  Stress: Not on file  Social Connections: Not on file  Intimate Partner Violence: Not on file     Physical Exam   Vitals:   07/16/22 2200 07/16/22 2230  BP: 125/75 126/86  Pulse: 72 70  Resp: 14 14  Temp:    SpO2: 100% 100%    CONSTITUTIONAL: Well-appearing, NAD NEURO/PSYCH:  Alert and oriented x 3, no focal deficits EYES:  eyes equal and reactive ENT/NECK:  no LAD, no JVD CARDIO: Regular rate, well-perfused, normal S1 and S2 PULM:  CTAB no wheezing or rhonchi GI/GU:  non-distended, non-tender MSK/SPINE:  No gross deformities, no edema SKIN:  no rash, atraumatic   *Additional and/or pertinent findings included in MDM below  Diagnostic and Interventional Summary    EKG Interpretation  Date/Time:  Tuesday July 16 2022 18:22:22 EST Ventricular Rate:  87 PR Interval:  124 QRS Duration: 74 QT Interval:  322 QTC Calculation: 387 R Axis:   24 Text Interpretation: Normal sinus rhythm with sinus arrhythmia Normal ECG When compared with ECG of 22-Jul-2021 19:45, No significant change was found Confirmed by  Kennis Carina [id number]) on 07/16/2022 11:00:02 PM       Labs Reviewed  BASIC METABOLIC PANEL  CBC  TROPONIN I (HIGH SENSITIVITY)    DG Chest 2 View  Final Result      Medications  naproxen (NAPROSYN) tablet 500 mg (has no administration in time range)     Procedures  /  Critical Care Procedures  ED Course and Medical Decision Making  Initial Impression and Ddx Reproducible chest pain on the left, highly favoring MSK related pain.  Triage workup very reassuring with normal chest x-ray, reassuring EKG.  Highly doubt PE, PERC negative.  No wheezing.  Past medical/surgical history that increases complexity of ED encounter: Asthma  Interpretation of Diagnostics I personally reviewed the EKG and my interpretation is as  follows: Sinus rhythm  Labs reveal no significant blood count or electrolyte disturbance, troponin negative.  Patient Reassessment and Ultimate Disposition/Management     Discharge  Patient management required discussion with the following services or consulting groups:  None  Complexity of Problems Addressed Acute illness or injury that poses threat of life of bodily function  Additional Data Reviewed and Analyzed Further history obtained from: None  Additional Factors Impacting ED Encounter Risk Prescriptions  Elmer Sow. Pilar Plate, MD [facility] Emergency Medicine [facility] [email or web address]  Final Clinical Impressions(s) / ED Diagnoses     ICD-10-CM   1. Chest pain, unspecified type  R07.9       ED Discharge Orders          Ordered    naproxen (NAPROSYN) 500 MG tablet  2 times daily        07/16/22 2329             Discharge Instructions Discussed with and Provided to Patient:    Discharge Instructions      You were evaluated in the Emergency Department and after careful evaluation, we did not find any emergent condition requiring admission or further testing in the hospital.  Your exam/testing today is overall reassuring.  Take the Naprosyn anti-inflammatory twice daily as needed for pain.  Please return to the Emergency Department if you experience any worsening of your condition.   Thank you for allowing [place] to be a part of your care.      Sabas Sous, MD 07/16/22 [phone]

## 2022-07-18 ENCOUNTER — Telehealth: Payer: Self-pay | Admitting: *Deleted

## 2022-07-18 NOTE — Patient Outreach (Signed)
  Care Coordination [facility] Note Transition Care Management Follow-up Telephone Call Date of discharge and from where: 07/16/22 from [facility] ED How have you been since you were released from the hospital? Patient continues to have chest pain Any questions or concerns? Yes  Items Reviewed: Did the pt receive and understand the discharge instructions provided? Yes  Medications obtained and verified? Yes  Other? No  Any new allergies since your discharge? No  Dietary orders reviewed? Yes Do you have support at home? Yes   Home Care and Equipment/Supplies: Were home health services ordered? no If so, what is the name of the agency? N/A  Has the agency set up a time to come to the patient's home? not applicable Were any new equipment or medical supplies ordered?  No What is the name of the medical supply agency? N/A Were you able to get the supplies/equipment? not applicable Do you have any questions related to the use of the equipment or supplies? No  Functional Questionnaire: (I = Independent and D = Dependent) ADLs: I  Bathing/Dressing- I  Meal Prep- I  Eating- I  Maintaining continence- I  Transferring/Ambulation- I  Managing Meds- I  Follow up appointments reviewed:  PCP Hospital f/u appt confirmed?  N/A-ED visit  Patient has called to schedule with PCP. Specialist Hospital f/u appt confirmed?  N/A-ED visit  . Are transportation arrangements needed? No  If their condition worsens, is the pt aware to call PCP or go to the Emergency Dept.? Yes Was the patient provided with contact information for the PCP's office or ED? No Was to pt encouraged to call back with questions or concerns? Yes   Estanislado Emms RN, BSN [place]  [employer]

## 2022-10-03 ENCOUNTER — Encounter: Payer: Self-pay | Admitting: Radiology

## 2022-11-20 ENCOUNTER — Emergency Department (HOSPITAL_COMMUNITY)
Admission: EM | Admit: 2022-11-20 | Discharge: 2022-11-20 | Disposition: A | Discharge status: Home / Self Care | Payer: Medicaid Other | Attending: Emergency Medicine | Admitting: Emergency Medicine

## 2022-11-20 ENCOUNTER — Emergency Department (HOSPITAL_COMMUNITY): Payer: Medicaid Other

## 2022-11-20 ENCOUNTER — Encounter (HOSPITAL_COMMUNITY): Payer: Self-pay | Admitting: *Deleted

## 2022-11-20 ENCOUNTER — Other Ambulatory Visit: Payer: Self-pay

## 2022-11-20 DIAGNOSIS — M791 Myalgia, unspecified site: Secondary | ICD-10-CM | POA: Insufficient documentation

## 2022-11-20 DIAGNOSIS — R109 Unspecified abdominal pain: Secondary | ICD-10-CM | POA: Diagnosis not present

## 2022-11-20 DIAGNOSIS — M545 Low back pain, unspecified: Secondary | ICD-10-CM | POA: Diagnosis not present

## 2022-11-20 LAB — URINALYSIS, ROUTINE W REFLEX MICROSCOPIC
Bacteria, UA: NONE SEEN
Bilirubin Urine: NEGATIVE
Glucose, UA: NEGATIVE mg/dL
Hgb urine dipstick: NEGATIVE
Ketones, ur: NEGATIVE mg/dL
Leukocytes,Ua: NEGATIVE
Nitrite: NEGATIVE
Protein, ur: 30 mg/dL — AB
Specific Gravity, Urine: 1.02 (ref 1.005–1.030)
pH: 7 (ref 5.0–8.0)

## 2022-11-20 LAB — BASIC METABOLIC PANEL
Anion gap: 8 (ref 5–15)
BUN: 11 mg/dL (ref 6–20)
CO2: 27 mmol/L (ref 22–32)
Calcium: 9 mg/dL (ref 8.9–10.3)
Chloride: 102 mmol/L (ref 98–111)
Creatinine, Ser: 1.06 mg/dL (ref 0.61–1.24)
GFR, Estimated: >60 mL/min (ref >60)
Glucose, Bld: 120 mg/dL — ABNORMAL HIGH (ref 70–99)
Potassium: 3.4 mmol/L — ABNORMAL LOW (ref 3.5–5.1)
Sodium: 137 mmol/L (ref 135–145)

## 2022-11-20 LAB — CBC
HCT: 41.4 % (ref 39.0–52.0)
Hemoglobin: 13.6 g/dL (ref 13.0–17.0)
MCH: 28.4 pg (ref 26.0–34.0)
MCHC: 32.9 g/dL (ref 30.0–36.0)
MCV: 86.4 fL (ref 80.0–100.0)
Platelets: 221 10*3/uL (ref 150–400)
RBC: 4.79 MIL/uL (ref 4.22–5.81)
RDW: 12 % (ref 11.5–15.5)
WBC: 4.4 10*3/uL (ref 4.0–10.5)
nRBC: 0 % (ref 0.0–0.2)

## 2022-11-20 LAB — HEPATIC FUNCTION PANEL
ALT: 15 U/L (ref 0–44)
AST: 22 U/L (ref 15–41)
Albumin: 4.4 g/dL (ref 3.5–5.0)
Alkaline Phosphatase: 51 U/L (ref 38–126)
Bilirubin, Direct: 0.1 mg/dL (ref 0.0–0.2)
Indirect Bilirubin: 0.9 mg/dL (ref 0.3–0.9)
Total Bilirubin: 1 mg/dL (ref 0.3–1.2)
Total Protein: 7.3 g/dL (ref 6.5–8.1)

## 2022-11-20 LAB — TSH: TSH: 1.878 u[IU]/mL (ref 0.350–4.500)

## 2022-11-20 LAB — RAPID HIV SCREEN (HIV 1/2 AB+AG)
HIV 1/2 Antibodies: NONREACTIVE
HIV-1 P24 Antigen - HIV24: NONREACTIVE

## 2022-11-20 NOTE — Discharge Instructions (Addendum)
SEEK IMMEDIATE MEDICAL ATTENTION IF: New numbness, tingling, weakness, or problem with the use of your arms or legs.  Severe back pain not relieved with medications.  Change in bowel or bladder control.  Increasing pain in any areas of the body (such as chest or abdominal pain).  Shortness of breath, dizziness or fainting.  Nausea (feeling sick to your stomach), vomiting, fever, or sweats.  

## 2022-11-20 NOTE — ED Provider Notes (Signed)
[place] EMERGENCY DEPARTMENT AT [facility] Provider Note   CSN: [id number] Arrival date & time: 11/20/22  1222     History  Chief Complaint  Patient presents with   Flank Pain    Aaron Carroll is a 22 y.o. male who presents emergency department with chief complaint of right abdominal flank pain and inability to gain weight.  Patient states he is always been very thin but recently his family has noticed that he has been losing weight.  He states he rarely has an appetite.  He is not currently sexually sexually active but states he has never been tested for STIs.  He denies any current symptoms.  He is concerned he might have something going on with his kidneys due to a family history of renal failure.  He states that he has pain all the time.  He is unsure if anything seems to make it any worse or better but does state that it hurts worse in his back sometimes when he bends over.  He is taking Aleve daily.  He denies abdominal pain, nausea or vomiting.  He denies use of alcohol or drugs   Flank Pain       Home Medications Prior to Admission medications   Medication Sig Start Date End Date Taking? Authorizing Provider  albuterol (PROAIR HFA) 108 (90 Base) MCG/ACT inhaler INHALE 2 PUFFS INTO THE LUNGS EVERY 4-6 HOURS AS NEEDED FOR WHEEZING. Patient not taking: Reported on 11/20/2022 05/11/20   Rosiland Oz, MD  fluticasone [facility] HFA) 220 MCG/ACT inhaler Inhale 2 puffs into the lungs 2 (two) times daily. INHALE 2 PUFFS TWICE DAILY AS DIRECTED. Patient not taking: Reported on 11/20/2022 05/11/20   Rosiland Oz, MD  ibuprofen (ADVIL) 600 MG tablet Take 1 tablet (600 mg total) by mouth every 6 (six) hours as needed. Patient not taking: Reported on 07/18/2022 12/06/21   Leath-Warren, Sadie Haber, NP  lidocaine (LIDODERM) 5 % Place 1 patch onto the skin daily. Remove & Discard patch within 12 hours or as directed by MD Patient not taking: Reported on 07/18/2022  09/16/20   Gailen Shelter, PA  methocarbamol (ROBAXIN) 500 MG tablet Take 1 tablet (500 mg total) by mouth 2 (two) times daily. Patient not taking: Reported on 07/18/2022 09/16/20   Gailen Shelter, PA  naproxen (NAPROSYN) 500 MG tablet Take 1 tablet (500 mg total) by mouth 2 (two) times daily. Patient not taking: Reported on 11/20/2022 07/16/22   Sabas Sous, MD      Allergies    Patient has no known allergies.    Review of Systems   Review of Systems  Genitourinary:  Positive for flank pain.    Physical Exam Updated Vital Signs BP 117/82   Pulse 69   Temp 98 F (36.7 C) (Oral)   Resp 18   Ht  (1.651 m)   Wt 53.5 kg   SpO2 100%   BMI 19.64 kg/m  Physical Exam Vitals and nursing note reviewed.  Constitutional:      General: He is not in acute distress.    Appearance: He is well-developed. He is not diaphoretic.  HENT:     Head: Normocephalic and atraumatic.  Eyes:     General: No scleral icterus.    Conjunctiva/sclera: Conjunctivae normal.  Cardiovascular:     Rate and Rhythm: Normal rate and regular rhythm.     Heart sounds: Normal heart sounds.  Pulmonary:     Effort: Pulmonary  effort is normal. No respiratory distress.     Breath sounds: Normal breath sounds.  Abdominal:     General: There is no distension.     Palpations: Abdomen is soft.     Tenderness: There is no abdominal tenderness. There is no right CVA tenderness or left CVA tenderness.     Hernia: No hernia is present.  Musculoskeletal:     Cervical back: Normal range of motion and neck supple.  Skin:    General: Skin is warm and dry.  Neurological:     Mental Status: He is alert.  Psychiatric:        Behavior: Behavior normal.     ED Results / Procedures / Treatments   Labs (all labs ordered are listed, but only abnormal results are displayed) Labs Reviewed  BASIC METABOLIC PANEL - Abnormal; Notable for the following components:      Result Value   Potassium 3.4 (*)    Glucose,  Bld 120 (*)    All other components within normal limits  URINALYSIS, ROUTINE W REFLEX MICROSCOPIC - Abnormal; Notable for the following components:   Protein, ur 30 (*)    All other components within normal limits  RPR - Abnormal; Notable for the following components:   RPR Ser Ql Reactive (*)    All other components within normal limits  CBC  RAPID HIV SCREEN (HIV 1/2 AB+AG)  TSH  HEPATIC FUNCTION PANEL  T.PALLIDUM AB, TOTAL    EKG None  Radiology DG Abdomen 1 View  Result Date: 11/20/2022 CLINICAL DATA:  Abdominal pain EXAM: ABDOMEN - 1 VIEW COMPARISON:  None Available. FINDINGS: Gas is seen in nondilated loops of colon. Minimal small bowel gas. There is gas and stool in the rectum. No obstruction. No obvious free air on this portable supine radiograph. The diaphragm is clipped off the edge of the film. Curvature of the spine could be positional. IMPRESSION: Nonspecific bowel gas pattern.  Distal colonic stool. Electronically Signed   By: Karen Kays M.D.   On: 11/20/2022 14:03    Procedures Procedures    Medications Ordered in ED Medications - No data to display  ED Course/ Medical Decision Making/ A&P                             Medical Decision Making Regarding the patient's flank pain, differential includes but is not limited to nephrolithiasis, pyelonephritis, MSK pain, referred pain from cholelithiasis. Less likely pulmonary source such as pneumonia or PE with no cardiopulmonary complaints, no hypoxia, and no increased work of breathing. Less likely aortic dissection with nontoxic appearance, no cardiopulmonary complaints, and no pulse or neurologic deficits.  Labs reviewed, no significant abnormalities noted including urinalysis, kidney function.  I considered CT renal stone study however CT imager is currently down and 1 view abdomen shows no evidence of abnormal bowel gas patterns or radiopaque stone.  Patient has HIV RPR and TSH currently pending.  No evidence  of kidney abnormality.  This appears to be musculoskeletal.  I discussed this findings with the patient and he states that his primary care doctor told him the same thing.  I reiterated his PCPs decisions and him in full agreement.  Suggest outpatient physical therapy, chiropractor, stretching, exercise, massage therapist.  Patient appears appropriate for discharge at this time.   Amount and/or Complexity of Data Reviewed Labs: ordered. Radiology: ordered.           Final Clinical  Impression(s) / ED Diagnoses Final diagnoses:  Muscular pain    Rx / DC Orders ED Discharge Orders     None         Arthor Captain, PA-C 11/21/22 1610    Benjiman Core, MD 11/23/22 [id number]

## 2022-11-20 NOTE — ED Triage Notes (Signed)
Pt in c/o R flank pain onset x 2 yrs worsening x 2 wks, pt denies hematuria, pt c/o dysuria intermittently, pt states, " I was told by one doctor that it was nothing and another doctor told me that it could be kidney failure or kidney cancer." Pt in NAD in triage, A&O x4

## 2022-11-21 LAB — RPR
RPR Ser Ql: REACTIVE — AB
RPR Titer: 1:64 {titer}

## 2022-11-22 LAB — T.PALLIDUM AB, TOTAL: T Pallidum Abs: REACTIVE — AB

## 2022-11-25 DIAGNOSIS — F17210 Nicotine dependence, cigarettes, uncomplicated: Secondary | ICD-10-CM | POA: Diagnosis not present

## 2022-11-25 DIAGNOSIS — R109 Unspecified abdominal pain: Secondary | ICD-10-CM | POA: Diagnosis not present

## 2022-11-25 DIAGNOSIS — J45909 Unspecified asthma, uncomplicated: Secondary | ICD-10-CM | POA: Diagnosis not present

## 2022-11-25 DIAGNOSIS — R030 Elevated blood-pressure reading, without diagnosis of hypertension: Secondary | ICD-10-CM | POA: Diagnosis not present

## 2022-12-03 ENCOUNTER — Other Ambulatory Visit: Payer: Self-pay

## 2022-12-03 ENCOUNTER — Emergency Department (HOSPITAL_COMMUNITY)
Admission: EM | Admit: 2022-12-03 | Discharge: 2022-12-03 | Disposition: A | Discharge status: Home / Self Care | Payer: Medicaid Other | Attending: Emergency Medicine | Admitting: Emergency Medicine

## 2022-12-03 ENCOUNTER — Emergency Department (HOSPITAL_COMMUNITY): Payer: Medicaid Other

## 2022-12-03 ENCOUNTER — Encounter (HOSPITAL_COMMUNITY): Payer: Self-pay | Admitting: Emergency Medicine

## 2022-12-03 DIAGNOSIS — R9431 Abnormal electrocardiogram [ECG] [EKG]: Secondary | ICD-10-CM | POA: Diagnosis not present

## 2022-12-03 DIAGNOSIS — Z113 Encounter for screening for infections with a predominantly sexual mode of transmission: Secondary | ICD-10-CM | POA: Diagnosis not present

## 2022-12-03 DIAGNOSIS — R410 Disorientation, unspecified: Secondary | ICD-10-CM | POA: Diagnosis not present

## 2022-12-03 DIAGNOSIS — T50905A Adverse effect of unspecified drugs, medicaments and biological substances, initial encounter: Secondary | ICD-10-CM | POA: Diagnosis not present

## 2022-12-03 DIAGNOSIS — R443 Hallucinations, unspecified: Secondary | ICD-10-CM | POA: Diagnosis not present

## 2022-12-03 DIAGNOSIS — J45909 Unspecified asthma, uncomplicated: Secondary | ICD-10-CM | POA: Diagnosis not present

## 2022-12-03 DIAGNOSIS — R4182 Altered mental status, unspecified: Secondary | ICD-10-CM | POA: Insufficient documentation

## 2022-12-03 DIAGNOSIS — A539 Syphilis, unspecified: Secondary | ICD-10-CM | POA: Diagnosis not present

## 2022-12-03 DIAGNOSIS — R609 Edema, unspecified: Secondary | ICD-10-CM | POA: Diagnosis not present

## 2022-12-03 DIAGNOSIS — Z1159 Encounter for screening for other viral diseases: Secondary | ICD-10-CM | POA: Diagnosis not present

## 2022-12-03 DIAGNOSIS — R109 Unspecified abdominal pain: Secondary | ICD-10-CM | POA: Diagnosis not present

## 2022-12-03 LAB — CBC WITH DIFFERENTIAL/PLATELET
Abs Immature Granulocytes: 0.02 10*3/uL (ref 0.00–0.07)
Basophils Absolute: 0.1 10*3/uL (ref 0.0–0.1)
Basophils Relative: 1 %
Eosinophils Absolute: 0 10*3/uL (ref 0.0–0.5)
Eosinophils Relative: 0 %
HCT: 42.9 % (ref 39.0–52.0)
Hemoglobin: 14 g/dL (ref 13.0–17.0)
Immature Granulocytes: 0 %
Lymphocytes Relative: 13 %
Lymphs Abs: 1.2 10*3/uL (ref 0.7–4.0)
MCH: 28.2 pg (ref 26.0–34.0)
MCHC: 32.6 g/dL (ref 30.0–36.0)
MCV: 86.5 fL (ref 80.0–100.0)
Monocytes Absolute: 0.4 10*3/uL (ref 0.1–1.0)
Monocytes Relative: 5 %
Neutro Abs: 7.6 10*3/uL (ref 1.7–7.7)
Neutrophils Relative %: 81 %
Platelets: 235 10*3/uL (ref 150–400)
RBC: 4.96 MIL/uL (ref 4.22–5.81)
RDW: 12 % (ref 11.5–15.5)
WBC: 9.4 10*3/uL (ref 4.0–10.5)
nRBC: 0 % (ref 0.0–0.2)

## 2022-12-03 LAB — COMPREHENSIVE METABOLIC PANEL
ALT: 19 U/L (ref 0–44)
AST: 22 U/L (ref 15–41)
Albumin: 4.4 g/dL (ref 3.5–5.0)
Alkaline Phosphatase: 55 U/L (ref 38–126)
Anion gap: 9 (ref 5–15)
BUN: 11 mg/dL (ref 6–20)
CO2: 25 mmol/L (ref 22–32)
Calcium: 8.8 mg/dL — ABNORMAL LOW (ref 8.9–10.3)
Chloride: 101 mmol/L (ref 98–111)
Creatinine, Ser: 0.93 mg/dL (ref 0.61–1.24)
GFR, Estimated: >60 mL/min (ref >60)
Glucose, Bld: 85 mg/dL (ref 70–99)
Potassium: 3.3 mmol/L — ABNORMAL LOW (ref 3.5–5.1)
Sodium: 135 mmol/L (ref 135–145)
Total Bilirubin: 0.9 mg/dL (ref 0.3–1.2)
Total Protein: 7.5 g/dL (ref 6.5–8.1)

## 2022-12-03 LAB — RAPID URINE DRUG SCREEN, HOSP PERFORMED
Amphetamines: NOT DETECTED
Barbiturates: NOT DETECTED
Benzodiazepines: NOT DETECTED
Cocaine: NOT DETECTED
Opiates: NOT DETECTED
Tetrahydrocannabinol: NOT DETECTED

## 2022-12-03 LAB — ETHANOL: Alcohol, Ethyl (B): <10 mg/dL (ref <10)

## 2022-12-03 NOTE — ED Provider Notes (Signed)
Prior to the psychiatry evaluation the patient reports that he is no longer hallucinating, the mother states that he is talking normal and is no longer had any delusions or hallucinations.  On my repeat exam the patient is comfortable, well-appearing and has no complaints, he seems to be interacting with the world around him in a normal way, I do not see any signs of psychosis but the patient and mother have been given follow-up information for local psychiatry.  Vitals normal, workup is unremarkable, stable for discharge, no indication for involuntary commitment or hospitalization at this time.   Eber Hong, MD 12/03/22 2231

## 2022-12-03 NOTE — ED Provider Notes (Signed)
[place] EMERGENCY DEPARTMENT AT [facility] Provider Note   CSN: [id number] Arrival date & time: 12/03/22  1552     History  Chief Complaint  Patient presents with   Altered Mental Status    Aaron Carroll is a 22 y.o. male.   Altered Mental Status  This patient is a 22 year old male, according to the mother he has a history of mild asthma but other than that all he has is "intellectual delay".  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  He generally was feeling poorly a couple of weeks ago when he was tested for syphilis and it came back positive.  He went to the health department today and while getting an intramuscular injection of penicillin he almost immediately started acting abnormally.  He was touching the walls, zoned out, he was hallucinating and is now saying that his mother is an Musician, very abnormal behavior and speech with delusions and hallucinations.  The patient denies any physical complaints    Home Medications Prior to Admission medications   Medication Sig Start Date End Date Taking? Authorizing Provider  albuterol (PROAIR HFA) 108 (90 Base) MCG/ACT inhaler INHALE 2 PUFFS INTO THE LUNGS EVERY 4-6 HOURS AS NEEDED FOR WHEEZING. Patient not taking: Reported on 11/20/2022 05/11/20   Rosiland Oz, MD  fluticasone [facility] HFA) 220 MCG/ACT inhaler Inhale 2 puffs into the lungs 2 (two) times daily. INHALE 2 PUFFS TWICE DAILY AS DIRECTED. Patient not taking: Reported on 11/20/2022 05/11/20   Rosiland Oz, MD  ibuprofen (ADVIL) 600 MG tablet Take 1 tablet (600 mg total) by mouth every 6 (six) hours as needed. Patient not taking: Reported on 07/18/2022 12/06/21   Leath-Warren, Sadie Haber, NP  lidocaine (LIDODERM) 5 % Place 1 patch onto the skin daily.  Remove & Discard patch within 12 hours or as directed by MD Patient not taking: Reported on 07/18/2022 09/16/20   Gailen Shelter, PA  methocarbamol (ROBAXIN) 500 MG tablet Take 1 tablet (500 mg total) by mouth 2 (two) times daily. Patient not taking: Reported on 07/18/2022 09/16/20   Gailen Shelter, PA  naproxen (NAPROSYN) 500 MG tablet Take 1 tablet (500 mg total) by mouth 2 (two) times daily. Patient not taking: Reported on 11/20/2022 07/16/22   Sabas Sous, MD      Allergies    Patient has no known allergies.    Review of Systems   Review of Systems  All other systems reviewed and are negative.   Physical Exam Updated Vital Signs BP (!) 139/95   Pulse 77   Temp 98.1 F (36.7 C) (Oral)   Resp 16   Ht 1.651 m (5\' 5" )   Wt 54 kg   SpO2 100%   BMI 19.81 kg/m  Physical Exam Vitals and nursing note reviewed.  Constitutional:      General: He is not in acute distress.    Appearance: He is well-developed.  HENT:     Head: Normocephalic and atraumatic.     Mouth/Throat:     Pharynx: No oropharyngeal exudate.  Eyes:     General: No scleral icterus.       Right eye: No discharge.        Left eye: No discharge.  Conjunctiva/sclera: Conjunctivae normal.     Pupils: Pupils are equal, round, and reactive to light.  Neck:     Thyroid: No thyromegaly.     Vascular: No JVD.  Cardiovascular:     Rate and Rhythm: Normal rate and regular rhythm.     Heart sounds: Normal heart sounds. No murmur heard.    No friction rub. No gallop.  Pulmonary:     Effort: Pulmonary effort is normal. No respiratory distress.     Breath sounds: Normal breath sounds. No wheezing or rales.  Abdominal:     General: Bowel sounds are normal. There is no distension.     Palpations: Abdomen is soft. There is no mass.     Tenderness: There is no abdominal tenderness.  Musculoskeletal:        General: No tenderness. Normal range of motion.     Cervical back: Normal range of motion and neck  supple.     Right lower leg: No edema.     Left lower leg: No edema.  Lymphadenopathy:     Cervical: No cervical adenopathy.  Skin:    General: Skin is warm and dry.     Findings: No erythema or rash.  Neurological:     Mental Status: He is alert.     Coordination: Coordination normal.     Comments: The patient is sleepy but arousable to voice, follows commands, he seems like he is zoned out, he does not make good eye contact, he speaks very quietly,  Psychiatric:     Comments: Extremely odd affect, poor eye contact, actively hallucinating     ED Results / Procedures / Treatments   Labs (all labs ordered are listed, but only abnormal results are displayed) Labs Reviewed  COMPREHENSIVE METABOLIC PANEL  ETHANOL  RAPID URINE DRUG SCREEN, HOSP PERFORMED  CBC WITH DIFFERENTIAL/PLATELET    EKG None  Radiology No results found.  Procedures Procedures    Medications Ordered in ED Medications - No data to display  ED Course/ Medical Decision Making/ A&P                             Medical Decision Making Amount and/or Complexity of Data Reviewed Labs: ordered. Radiology: ordered.   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  He is now having active military style hallucinations and delusions, this came on acutely after receiving an injection of penicillin, I suspect that he is having a psychotic break but will check for other things as well.  Vital signs are reassuring, the patient does not appear to be in severe medical distress  He is medically clear for psychiatry evaluation at 4:35 PM        Final Clinical Impression(s) / ED Diagnoses Final diagnoses:  Hallucinations    Rx / DC Orders ED Discharge Orders     None         Eber Hong, MD 12/03/22 1636

## 2022-12-03 NOTE — ED Notes (Signed)
Patient denies pain and is resting comfortably.  Mom at bedside

## 2022-12-03 NOTE — ED Notes (Signed)
Pt went to the restroom to get a urine sample, Gave pt some privacy to get the urine sample. I preceded to knock on the door to check on pt, Pt was standing staring at the urine cup and rocking back and forth. I explained to the pt that it was important to be able to get the urine sample. I gave the pt more time to get the sample, I notified the nurse to what the pt was doing. Nurse and I came back in the room and nurse let the pt know we needed the urine sample, Pt was able to give urine sample. The pt went and wash his hands with no usability problem. Pt also walked back to the bed and got in the bed with no issue.

## 2022-12-03 NOTE — ED Notes (Signed)
Sitting up in bed.  Grandmother at bedside.  Pt smiling.  No vol. To talk.  No distress

## 2022-12-03 NOTE — ED Notes (Signed)
Dr in talking to pt and Mom

## 2022-12-03 NOTE — Discharge Instructions (Signed)
Please follow-up with the psychiatrist listed above or the psychiatrist of your choice.  Return to the ER for severe or worsening symptoms

## 2022-12-03 NOTE — ED Notes (Signed)
Resting quielty. Grandmother at bedside.  Ice water given

## 2022-12-03 NOTE — ED Notes (Signed)
TTS messaged re the consult and they replied they have a walk in right now but know about it

## 2022-12-03 NOTE — ED Notes (Signed)
Pt went to [facility].Marland Kitchen  Not speaking at present.

## 2022-12-03 NOTE — ED Triage Notes (Signed)
Pt altered after receiving bacillin shot for syphilis at health dept. He received shots around 1453.

## 2022-12-03 NOTE — ED Notes (Signed)
Pt voiced concerned that she does not feel her son needs TTS consult and would like to take him home.  She would like to talk to the ED Drf. She states he is acting fine most of day.  I sent Dr Hyacinth Meeker a  message telling him this and asking him to speak to [facility]

## 2022-12-03 NOTE — ED Notes (Signed)
Patient transported to CT 

## 2022-12-03 NOTE — ED Notes (Signed)
Pt smiles when I walk into room and answers simple questions in normal voice tone. Mom states he is doing much better now.  That  he recognizes her and is talking with her in conversation

## 2022-12-03 NOTE — ED Notes (Signed)
Pt talking randomly with very high pitch tone voice.  Smiling and singing at times in very high pitch tone

## 2022-12-11 DIAGNOSIS — A539 Syphilis, unspecified: Secondary | ICD-10-CM | POA: Diagnosis not present

## 2023-07-15 ENCOUNTER — Emergency Department (HOSPITAL_COMMUNITY)
Admission: EM | Admit: 2023-07-15 | Discharge: 2023-07-15 | Discharge status: Against Medical Advice | Payer: Medicaid Other

## 2023-07-15 NOTE — ED Triage Notes (Signed)
Called x 3 no answer 

## 2023-07-15 NOTE — ED Triage Notes (Signed)
Called x one, no answer  

## 2023-07-15 NOTE — ED Triage Notes (Signed)
Called x2, no answer 

## 2023-08-04 ENCOUNTER — Ambulatory Visit: Payer: Medicaid Other | Attending: Cardiology | Admitting: Cardiology

## 2023-08-04 ENCOUNTER — Encounter: Payer: Self-pay | Admitting: Cardiology

## 2023-08-04 ENCOUNTER — Ambulatory Visit: Payer: Medicaid Other

## 2023-08-04 VITALS — BP 104/64 | HR 68 | Ht 65.0 in | Wt 113.0 lb

## 2023-08-04 DIAGNOSIS — J45909 Unspecified asthma, uncomplicated: Secondary | ICD-10-CM | POA: Diagnosis not present

## 2023-08-04 DIAGNOSIS — R0789 Other chest pain: Secondary | ICD-10-CM | POA: Diagnosis not present

## 2023-08-04 DIAGNOSIS — R002 Palpitations: Secondary | ICD-10-CM

## 2023-08-04 NOTE — Patient Instructions (Signed)
Medication Instructions:  Your physician recommends that you continue on your current medications as directed. Please refer to the Current Medication list given to you today.   Labwork: None today  Testing/Procedures: ZIO XT- Long Term Monitor Instructions   Your physician has requested you wear your ZIO patch monitor___14____days.   This is a single patch monitor.  [employer] supplies one patch monitor per enrollment.  Additional stickers are not available.       Do not shower for the first 24 hours.  You may shower after the first 24 hours.   Press button if you feel a symptom. You will hear a small click.  Record Date, Time and Symptom in the Patient Log Book.   When you are ready to remove patch, follow instructions on last 2 pages of Patient Log Book.  Stick patch monitor onto last page of Patient Log Book.   Place Patient Log Book in [place] box.  Use locking tab on box and tape box closed securely.  The [employer] and [employer] has [employer] on it.  Please place in mailbox as soon as possible.  Your physician should have your test results approximately 7 days after the monitor has been mailed back to [facility].   Call [facility] Customer Care at [phone] if you have questions regarding your ZIO XT patch monitor.  Call them immediately if you see an orange light blinking on your monitor.   If your monitor falls off in less than 4 days contact our Monitor department at [phone].  If your monitor becomes loose or falls off after 4 days call [employer] at [phone] for suggestions on securing your monitor.    Follow-Up: 6 weeks   Any Other Special Instructions Will Be Listed Below (If Applicable).  If you need a refill on your cardiac medications before your next appointment, please call your pharmacy.

## 2023-08-04 NOTE — Progress Notes (Signed)
Cardiology Office Note:  .   Date:  08/04/2023  ID:  Aaron Carroll, DOB 2001/02/06, MRN [id number] PCP: [facility], [facility]  [facility] Providers Cardiologist:  None    History of Present Illness: .   Aaron Carroll is a 22 y.o. male with a past medical history of chronic back pain and scoliosis, asthma, learning disability and delayed social development, atypical chest pain and palpitations, who is here today for follow-up after recent visit to the emergency department.  He was last seen in clinic 03/12/2021 by Dr. Eden Emms.  At that time he continued to have atypical chest discomfort and palpitations when he was wrestling with his siblings.  He underwent a stress echo that was ordered.  He had not been seen in the cardiology office since that time.  He has been evaluated in the emergency department 7 times since his last visit to clinic.  He has had varying complaints of chest pain, abdominal pain, leg pain, flank pain, and altered mental status.  He returns to clinic today with complaints of palpitations.  He states that he is had some shortness of breath and some atypical chest discomfort with the palpitations.  He states that he likes to go for walks and his mother has noted that his heart rate has been more elevated than normal and is also concerned.  He has noticed changes in his heart rate not only with activity but with rest as well.  He was recently registered in the emergency department but left without being seen for similar complaints.  His other complaint today is an increased amount of fatigue.  ROS: 10 point review of systems has been reviewed and considered negative with exception of what is been listed in HPI  Studies Reviewed: Marland Kitchen       Stress Echo 05/10/2021 1. This is an inconclusive stress echocardiogram for ischemia.  Non-diagnostic study for ischemia due to inadequate heart rate response,  77% MPHR. At achieved HR there were no diagnostic ST segment  changes on  ECG and LVEF increased from 55% to 70% with  no wall motion abnomalities.   2. This is an indeterminate risk study.  Risk Assessment/Calculations:             Physical Exam:   VS:  BP 104/64 (BP Location: Right Arm, Patient Position: Standing, Cuff Size: Normal)   Pulse 68   Ht 5\' 5"  (1.651 m)   Wt 113 lb (51.3 kg)   SpO2 96%   BMI 18.80 kg/m    Wt Readings from Last 3 Encounters:  08/04/23 113 lb (51.3 kg)  12/03/22 119 lb 0.8 oz (54 kg)  11/20/22 118 lb (53.5 kg)    GEN: Well nourished, well developed in no acute distress NECK: No JVD; No carotid bruits CARDIAC: RRR, no murmurs, rubs, gallops RESPIRATORY:  Clear to auscultation without rales, wheezing or rhonchi  ABDOMEN: Soft, non-tender, non-distended EXTREMITIES:  No edema; No deformity   ASSESSMENT AND PLAN: .   Palpitations that have been worsening throughout the day and evening.  He has found no aggravating or alleviating factors.  Denies a large quantity of caffeine, alcohol, or drug use.  TSH was normal on his last labs.  He endorses fatigue with his palpitations as well.  He has been placed on a ZIO XT monitor for 2 weeks to rule out arrhythmia.   Atypical chest pain that he associates with his palpitations.  He has had several emergency department visits that has  been unrevealing and the workup.  EKG completed in the emergency department in 12/2022 revealed sinus rhythm with rate of 77 and LVH.  No ischemic changes noted from prior studies.  After his monitor if he continues to have atypical complaints it can reschedule him for exercise tolerance testing.  Longstanding history of asthma with rescue of albuterol.  He states that the palpitations do worsen from inhaler use.        Dispo: Patient return to clinic to see MD/APP in 6 weeks or sooner if needed for reevaluation of symptoms  Signed, Elizabethanne Lusher, NP

## 2023-08-26 NOTE — Progress Notes (Signed)
Average heart rate 75 beats per minutes, predominant underlying rhythm was normal sinus. There were several changes noted throughout the night the heart rhythm. Often times it is a normal finding. Recommend a sleep study to rule out sleep apnea as a cause. If continues to remains symptomatic can refer to EP.

## 2023-08-27 ENCOUNTER — Telehealth: Payer: Self-pay

## 2023-08-27 DIAGNOSIS — R9431 Abnormal electrocardiogram [ECG] [EKG]: Secondary | ICD-10-CM

## 2023-08-27 NOTE — Telephone Encounter (Signed)
Results discussed with patient.he is agreeable to see pulmonary, referral placed.

## 2023-08-27 NOTE — Telephone Encounter (Signed)
-----   Message from [facility] sent at 08/26/2023  5:01 PM EST ----- Average heart rate 75 beats per minutes, predominant underlying rhythm was normal sinus. There were several changes noted throughout the night the heart rhythm. Often times it is a normal finding. Recommend a sleep study to rule out sleep apnea as a  cause. If continues to remains symptomatic can refer to EP.

## 2023-09-01 ENCOUNTER — Encounter: Payer: Self-pay | Admitting: *Deleted

## 2023-09-10 ENCOUNTER — Encounter (HOSPITAL_COMMUNITY): Payer: Self-pay

## 2023-09-10 ENCOUNTER — Other Ambulatory Visit: Payer: Self-pay

## 2023-09-10 ENCOUNTER — Emergency Department (HOSPITAL_COMMUNITY)
Admission: EM | Admit: 2023-09-10 | Discharge: 2023-09-10 | Discharge status: Against Medical Advice | Payer: Medicaid Other | Attending: Emergency Medicine | Admitting: Emergency Medicine

## 2023-09-10 DIAGNOSIS — R61 Generalized hyperhidrosis: Secondary | ICD-10-CM | POA: Diagnosis not present

## 2023-09-10 DIAGNOSIS — R55 Syncope and collapse: Secondary | ICD-10-CM | POA: Diagnosis not present

## 2023-09-10 DIAGNOSIS — R079 Chest pain, unspecified: Secondary | ICD-10-CM | POA: Diagnosis not present

## 2023-09-10 DIAGNOSIS — Z5321 Procedure and treatment not carried out due to patient leaving prior to being seen by health care provider: Secondary | ICD-10-CM | POA: Diagnosis not present

## 2023-09-10 DIAGNOSIS — R000 Tachycardia, unspecified: Secondary | ICD-10-CM | POA: Diagnosis not present

## 2023-09-10 NOTE — ED Triage Notes (Signed)
 [place] [facility] with [place]ope. Pt vitals per EMS BP 125/85 HR 101- BS-106 RR 17 98% RA- EKG- unremarkable.

## 2023-09-10 NOTE — ED Notes (Signed)
 Pt left without communicating to this nurse.

## 2023-09-16 ENCOUNTER — Ambulatory Visit: Payer: Medicaid Other | Admitting: Student

## 2023-09-16 NOTE — Progress Notes (Deleted)
   Cardiology Office Note    Date:  09/16/2023  ID:  Aaron Carroll, DOB [birth year], MRN [id number] Cardiologist: Charlton Haws, MD    History of Present Illness:    Aaron Carroll is a 23 y.o. male with past medical history of scoliosis, asthma, palpitations and atypical chest pain who presents to the office today for 6-week follow-up.  He was last examined by Aaron Quest, NP in 07/2023 and reported more frequent palpitations with associated shortness of breath. Recent labs had been reassuring and a follow-up monitor was recommended. This showed predominantly normal sinus rhythm with an average heart rate of 75 bpm.  He did have episodes of Wenckeback and rare PAC's and PVC's but less than a 1% burden.  Was referred to pulmonology to rule out OSA.  -Labs?  ROS: ***  Studies Reviewed:   EKG: EKG is*** ordered today and demonstrates ***.  EKG from 09/10/2023 is reviewed and shows normal sinus rhythm, heart rate 87 with PACs and no acute ST changes.   EKG Interpretation Date/Time:    Ventricular Rate:    PR Interval:    QRS Duration:    QT Interval:    QTC Calculation:   R Axis:      Text Interpretation:         Echo Stress Test: 05/2021 IMPRESSIONS     1. This is an inconclusive stress echocardiogram for ischemia.  Non-diagnostic study for ischemia due to inadequate heart rate response,  77% MPHR. At achieved HR there were no diagnostic ST segment changes on  ECG and LVEF increased from 55% to 70% with  no wall motion abnomalities.   2. This is an indeterminate risk study.   FINDINGS   Exam Protocol: The patient exercised on a treadmill according to a Bruce  protocol.   Event Monitor: 08/2023 Patch Wear Time:  13 days and 19 hours (2024-12-30T15:53:11-498 to 2025-01-13T11:51:03-499)   Patient had a min HR of 31 bpm, max HR of 173 bpm, and avg HR of 75 bpm. Predominant underlying rhythm was Sinus Rhythm. Slight P wave morphology changes were noted. Second  Degree AV Block-Mobitz I (Wenckebach) was present. Isolated SVEs were rare  (<1.0%), SVE Triplets were rare (<1.0%), and no SVE Couplets were present. Isolated VEs were rare (<1.0%), VE Couplets were rare (<1.0%), and no VE Triplets were present. Difficulty discerning atrial activity making definitive diagnosis difficult to  ascertain.    Risk Assessment/Calculations:   {Does this patient have ATRIAL FIBRILLATION?[phone]} No BP recorded.  {Refresh Note OR Click here to enter BP  :1}***         Physical Exam:   VS:  There were no vitals taken for this visit.   Wt Readings from Last 3 Encounters:  09/10/23 113 lb 1.5 oz (51.3 kg)  08/04/23 113 lb (51.3 kg)  12/03/22 119 lb 0.8 oz (54 kg)     GEN: Well nourished, well developed in no acute distress NECK: No JVD; No carotid bruits CARDIAC: ***RRR, no murmurs, rubs, gallops RESPIRATORY:  Clear to auscultation without rales, wheezing or rhonchi  ABDOMEN: Appears non-distended. No obvious abdominal masses. EXTREMITIES: No clubbing or cyanosis. No edema.  Distal pedal pulses are 2+ bilaterally.   Assessment and Plan:   1. Palpitations - ***  2. History of Chest Pain - ***  3. Asthma - ***  Signed, Aaron Lennox, PA-C

## 2023-11-11 ENCOUNTER — Ambulatory Visit: Payer: Medicaid Other | Attending: Student | Admitting: Student

## 2023-11-11 ENCOUNTER — Encounter: Payer: Self-pay | Admitting: Student

## 2023-11-11 VITALS — BP 114/78 | HR 81 | Ht 66.0 in | Wt 116.0 lb

## 2023-11-11 DIAGNOSIS — R002 Palpitations: Secondary | ICD-10-CM | POA: Diagnosis not present

## 2023-11-11 DIAGNOSIS — J45909 Unspecified asthma, uncomplicated: Secondary | ICD-10-CM | POA: Diagnosis not present

## 2023-11-11 DIAGNOSIS — Z87898 Personal history of other specified conditions: Secondary | ICD-10-CM | POA: Diagnosis not present

## 2023-11-11 NOTE — Patient Instructions (Signed)
   Lab Work:  CBC, BMET and TSH at [facility]  If you have labs (blood work) drawn today and your tests are completely normal, you will receive your results only by: [employer] (if you have MyChart) OR A paper copy in the mail If you have any lab test that is abnormal or we need to change your treatment, we will call you to review the results.   Follow-Up: At [facility], you and your health needs are our priority.  As part of our continuing mission to provide you with exceptional heart care, our providers are all part of one team.  This team includes your primary Cardiologist (physician) and Advanced Practice Providers or APPs (Physician Assistants and Nurse Practitioners) who all work together to provide you with the care you need, when you need it.  Your next appointment:   1 year(s)  Provider:   You may see Charlton Haws, MD or one of the following Advanced Practice Providers on your designated Care Team:   Randall An, PA-C  [place], [place] Jacolyn Reedy, [place]     We recommend signing up for the patient portal called "MyChart".  Sign up information is provided on this After Visit Summary.  MyChart is used to connect with patients for Virtual Visits (Telemedicine).  Patients are able to view lab/test results, encounter notes, upcoming appointments, etc.  Non-urgent messages can be sent to your provider as well.   To learn more about what you can do with MyChart, go to [email or web address].

## 2023-11-11 NOTE — Progress Notes (Signed)
 Cardiology Office Note    Date:  11/11/2023  ID:  Aaron Carroll, DOB November 28, 2000, MRN [id number] Cardiologist: Charlton Haws, MD    History of Present Illness:    Aaron Carroll is a 23 y.o. male with past medical history of scoliosis, asthma, palpitations and atypical chest pain who presents to the office today for 6-week follow-up.   He was last examined by Charlsie Quest, NP in 07/2023 and reported more frequent palpitations with associated shortness of breath. A follow-up monitor was recommended. This showed predominantly normal sinus rhythm with an average heart rate of 75 bpm. He did have episodes of Wenckeback and rare PAC's and PVC's but less than a 1% burden. Was referred to Pulmonology to rule out OSA.   In talking with the patient today, he reports his palpitations have improved since his prior visit. He was previously consuming several sodas a day and has reduced his caffeine consumption. Now trying to consume more water. He does not consume alcohol. Denies any exertional chest pain. Does report having shortness of breath with walking which has been occurring for several months and overall unchanged. He has not noticed an association of this with the temperature changes. No specific orthopnea, PND or pitting edema.  Studies Reviewed:   EKG: EKG is not ordered today.  Echo Stress Test: 05/2021 IMPRESSIONS     1. This is an inconclusive stress echocardiogram for ischemia.  Non-diagnostic study for ischemia due to inadequate heart rate response,  77% MPHR. At achieved HR there were no diagnostic ST segment changes on  ECG and LVEF increased from 55% to 70% with  no wall motion abnomalities.   2. This is an indeterminate risk study.   FINDINGS   Exam Protocol: The patient exercised on a treadmill according to a Bruce  protocol.    Event Monitor: 08/2023 Patch Wear Time:  13 days and 19 hours (2024-12-30T15:53:11-498 to 2025-01-13T11:51:03-499)   Patient had a min HR  of 31 bpm, max HR of 173 bpm, and avg HR of 75 bpm. Predominant underlying rhythm was Sinus Rhythm. Slight P wave morphology changes were noted. Second Degree AV Block-Mobitz I (Wenckebach) was present. Isolated SVEs were rare  (<1.0%), SVE Triplets were rare (<1.0%), and no SVE Couplets were present. Isolated VEs were rare (<1.0%), VE Couplets were rare (<1.0%), and no VE Triplets were present. Difficulty discerning atrial activity making definitive diagnosis difficult to  ascertain.   Physical Exam:   VS:  BP 114/78 (BP Location: Right Arm, Cuff Size: Normal)   Pulse 81   Ht 5\' 6"  (1.676 m)   Wt 116 lb (52.6 kg)   SpO2 97%   BMI 18.72 kg/m    Wt Readings from Last 3 Encounters:  11/11/23 116 lb (52.6 kg)  09/10/23 113 lb 1.5 oz (51.3 kg)  08/04/23 113 lb (51.3 kg)     GEN: Well nourished, well developed male appearing in no acute distress NECK: No JVD; No carotid bruits CARDIAC: RRR, no murmurs, rubs, gallops RESPIRATORY:  Clear to auscultation without rales, wheezing or rhonchi  ABDOMEN: Appears non-distended. No obvious abdominal masses. EXTREMITIES: No clubbing or cyanosis. No pitting edema.  Distal pedal pulses are 2+ bilaterally.   Assessment and Plan:   1. Palpitations - Recent monitor showed predominantly normal sinus rhythm as outlined above with rare PAC's and PVC's but less than 1% burden. He reports symptoms have improved with reduction of his caffeine intake and will continue with conservative measures for now given  improvement in symptoms. Given his timeframe since most recent labs,  will recheck a CBC, BMET (history of hypokalemia) and TSH.    2. History of Chest Pain - He denies any recent symptoms and prior stress echo in 05/2021 showed no acute ST changes but was unable to achieve target heart rate. Given no recurrent symptoms, do not need to pursue further cardiac testing at this time.  Could consider a Coronary CT in the future if recurrent symptoms.   3.  Asthma - Followed by his PCP and uses Albuterol as needed. He does have upcoming follow-up with Pulmonology scheduled for next week. Given his episodes of nocturnal bradycardia, was also recommended to consider evaluation for OSA.     Signed, Ellsworth Lennox, PA-C

## 2023-11-17 ENCOUNTER — Encounter: Payer: Self-pay | Admitting: Primary Care

## 2023-11-17 ENCOUNTER — Ambulatory Visit: Payer: Medicaid Other | Admitting: Primary Care

## 2024-02-11 ENCOUNTER — Telehealth: Payer: Self-pay

## 2024-02-11 NOTE — Telephone Encounter (Signed)
 ATC for precharting no vm

## 2024-02-12 ENCOUNTER — Encounter: Payer: Self-pay | Admitting: Adult Health

## 2024-02-12 ENCOUNTER — Ambulatory Visit: Admitting: Adult Health

## 2024-04-23 ENCOUNTER — Encounter: Payer: Self-pay | Admitting: *Deleted

## 2024-08-07 ENCOUNTER — Other Ambulatory Visit: Payer: Self-pay

## 2024-08-07 ENCOUNTER — Emergency Department (HOSPITAL_COMMUNITY)
Admission: EM | Admit: 2024-08-07 | Discharge: 2024-08-07 | Disposition: A | Discharge status: Home / Self Care | Source: Home / Self Care | Attending: Emergency Medicine | Admitting: Emergency Medicine

## 2024-08-07 ENCOUNTER — Encounter (HOSPITAL_COMMUNITY): Payer: Self-pay | Admitting: Emergency Medicine

## 2024-08-07 DIAGNOSIS — X58XXXD Exposure to other specified factors, subsequent encounter: Secondary | ICD-10-CM | POA: Insufficient documentation

## 2024-08-07 DIAGNOSIS — S61309D Unspecified open wound of unspecified finger with damage to nail, subsequent encounter: Secondary | ICD-10-CM

## 2024-08-07 DIAGNOSIS — S61101D Unspecified open wound of right thumb with damage to nail, subsequent encounter: Secondary | ICD-10-CM | POA: Diagnosis present

## 2024-08-07 NOTE — ED Triage Notes (Signed)
 Pov c/o of right thumb pain from a previous injury this past September. Nail appears to be peeling off with some bleeding. Pt states xrays from sept were negative of bone injury

## 2024-08-07 NOTE — ED Provider Notes (Signed)
 " [place] EMERGENCY DEPARTMENT AT [facility] Provider Note   CSN: [id number] Arrival date & time: 08/07/24  2158     Patient presents with: Finger Injury   Aaron Carroll is a 24 y.o. male.  Patient is a 24 year old male who presents to the ED for an injury to the right fingernail.  Patient notes that he slammed the finger in a car door back in September 2024.  He notes there was a small blood clot under the nail.  He was seen in urgent care a few days later but was told they could not drain the area.  He states he was then seen at the ED and had a negative x-ray.  They note the nail has started to peel off but began bleeding today so they came to the ED.  States the nail is painful if he tries to move it.  Denies any redness to the thumb or new fevers.  No further complaints.   HPI     Prior to Admission medications  Medication Sig Start Date End Date Taking? Authorizing Provider  albuterol  (PROAIR  HFA) 108 (90 Base) MCG/ACT inhaler INHALE 2 PUFFS INTO THE LUNGS EVERY 4-6 HOURS AS NEEDED FOR WHEEZING. 05/11/20   Theotis Allena HERO, MD  naproxen  sodium (ALEVE ) 220 MG tablet Take 220 mg by mouth daily as needed (pain).    [provider]    Allergies: Penicillins    Review of Systems  Constitutional:  Negative for fever.  Skin:  Positive for wound. Negative for color change.  All other systems reviewed and are negative.   Updated Vital Signs BP (!) 130/92 (BP Location: Left Arm)   Pulse 86   Temp 98.2 F (36.8 C) (Oral)   Resp 16   Ht 5' 6 (1.676 m)   Wt 49 kg   SpO2 99%   BMI 17.43 kg/m   Physical Exam Musculoskeletal:     Comments: The right thumbnail is avulsed with underlying hematoma.  There is only connection on the medial side of the nail.  No active bleeding or drainage.  The nail bed has continued to grow with new nail forming.  Full range of motion of the thumb with no pain.     (all labs ordered are listed, but only abnormal  results are displayed) Labs Reviewed - No data to display  EKG: None  Radiology: No results found.    Medications Ordered in the ED - No data to display                                 Medical Decision Making Patient is a 24 year old male who presents to the ED for an avulsed thumbnail that has been present for several months.  Please see detailed HPI above.  On exam patient is alert and in no acute distress.  Physical exam as noted above.  The right thumbnail is avulsed with underlying hematoma.  No acute signs of infection or drainage at this time.  I did offer to remove the nail from patient but he states he would not be able to do the numbing medicine.  The nail is still tender and will not allow me to remove the nail.  Patient is sure he does not want the nail removed at this time.  This is reasonable as this has been ongoing for several months.  The nail will eventually fall off on its own.  Otherwise well-appearing and vital signs stable.  Stable for discharge home.  Symptomatic care for the nail discussed.  Advised PCP follow-up if he does want the nail removed.  Return precautions provided for any worsening symptoms.       Final diagnoses:  Avulsion of fingernail, subsequent encounter    ED Discharge Orders     None          Neysa Thersia RAMAN, [place] 08/07/24 2232  "

## 2024-08-07 NOTE — Discharge Instructions (Signed)
 Soak thumb in warm water to help loosen the nail.  Eventually will be able to peel the nail off.  This may bleed a little but this is normal.  May follow-up with primary care doctor for further evaluation if you do potentially want to get it removed.  Return to ED if any symptoms worsen including signs of infection to the thumb or severe uncontrollable pain.

## 2024-10-14 ENCOUNTER — Ambulatory Visit
